# Patient Record
Sex: Female | Born: 1995 | Race: Black or African American | Hispanic: No | Marital: Single | Smoking: Former smoker
Health system: Southern US, Community
[De-identification: ages and names within clinical notes are randomized; demographics above are authoritative.]

## PROBLEM LIST (undated history)

## (undated) DIAGNOSIS — I10 Essential (primary) hypertension: Secondary | ICD-10-CM

## (undated) DIAGNOSIS — A6000 Herpesviral infection of urogenital system, unspecified: Secondary | ICD-10-CM

## (undated) DIAGNOSIS — Z789 Other specified health status: Secondary | ICD-10-CM

## (undated) HISTORY — DX: Other specified health status: Z78.9

## (undated) HISTORY — DX: Essential (primary) hypertension: I10

## (undated) HISTORY — PX: WRIST SURGERY: SHX841

---
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
## 2016-07-31 ENCOUNTER — Emergency Department (HOSPITAL_COMMUNITY)
Admission: EM | Admit: 2016-07-31 | Discharge: 2016-07-31 | Disposition: A | Discharge status: Home / Self Care | Payer: Medicaid Other | Attending: Emergency Medicine | Admitting: Emergency Medicine

## 2016-07-31 ENCOUNTER — Encounter (HOSPITAL_COMMUNITY): Payer: Self-pay

## 2016-07-31 DIAGNOSIS — R1110 Vomiting, unspecified: Secondary | ICD-10-CM | POA: Diagnosis present

## 2016-07-31 DIAGNOSIS — R112 Nausea with vomiting, unspecified: Secondary | ICD-10-CM | POA: Insufficient documentation

## 2016-07-31 DIAGNOSIS — R197 Diarrhea, unspecified: Secondary | ICD-10-CM

## 2016-07-31 LAB — URINALYSIS, ROUTINE W REFLEX MICROSCOPIC
Bilirubin Urine: NEGATIVE
Glucose, UA: NEGATIVE mg/dL
Hgb urine dipstick: NEGATIVE
Ketones, ur: 80 mg/dL — AB
Nitrite: NEGATIVE
Protein, ur: 30 mg/dL — AB
Specific Gravity, Urine: 1.021 (ref 1.005–1.030)
pH: 6 (ref 5.0–8.0)

## 2016-07-31 LAB — COMPREHENSIVE METABOLIC PANEL
ALT: 25 U/L (ref 14–54)
AST: 26 U/L (ref 15–41)
Albumin: 4.6 g/dL (ref 3.5–5.0)
Alkaline Phosphatase: 66 U/L (ref 38–126)
Anion gap: 7 (ref 5–15)
BUN: 15 mg/dL (ref 6–20)
CO2: 27 mmol/L (ref 22–32)
Calcium: 9.9 mg/dL (ref 8.9–10.3)
Chloride: 107 mmol/L (ref 101–111)
Creatinine, Ser: 0.68 mg/dL (ref 0.44–1.00)
GFR calc Af Amer: >60 mL/min (ref >60)
GFR calc non Af Amer: >60 mL/min (ref >60)
Glucose, Bld: 115 mg/dL — ABNORMAL HIGH (ref 65–99)
Potassium: 3.8 mmol/L (ref 3.5–5.1)
Sodium: 141 mmol/L (ref 135–145)
Total Bilirubin: 0.3 mg/dL (ref 0.3–1.2)
Total Protein: 8.4 g/dL — ABNORMAL HIGH (ref 6.5–8.1)

## 2016-07-31 LAB — I-STAT BETA HCG BLOOD, ED (MC, WL, AP ONLY): I-stat hCG, quantitative: <5 m[IU]/mL (ref <5)

## 2016-07-31 LAB — LIPASE, BLOOD: Lipase: <10 U/L — ABNORMAL LOW (ref 11–51)

## 2016-07-31 LAB — CBC
HCT: 38 % (ref 36.0–46.0)
Hemoglobin: 13.2 g/dL (ref 12.0–15.0)
MCH: 30.7 pg (ref 26.0–34.0)
MCHC: 34.7 g/dL (ref 30.0–36.0)
MCV: 88.4 fL (ref 78.0–100.0)
Platelets: 273 10*3/uL (ref 150–400)
RBC: 4.3 MIL/uL (ref 3.87–5.11)
RDW: 13 % (ref 11.5–15.5)
WBC: 11 10*3/uL — ABNORMAL HIGH (ref 4.0–10.5)

## 2016-07-31 MED ORDER — KETOROLAC TROMETHAMINE 30 MG/ML IJ SOLN
30.0000 mg | Freq: Once | INTRAMUSCULAR | Status: AC
  Administered 2016-07-31: 30 mg via INTRAVENOUS
  Filled 2016-07-31: qty 1

## 2016-07-31 MED ORDER — ONDANSETRON 4 MG PO TBDP: ORAL_TABLET | ORAL | 0 refills | Status: DC

## 2016-07-31 MED ORDER — ONDANSETRON HCL 4 MG/2ML IJ SOLN
4.0000 mg | Freq: Once | INTRAMUSCULAR | Status: AC
  Administered 2016-07-31: 4 mg via INTRAVENOUS
  Filled 2016-07-31: qty 2

## 2016-07-31 MED ORDER — GI COCKTAIL ~~LOC~~
30.0000 mL | Freq: Once | ORAL | Status: AC
  Administered 2016-07-31: 30 mL via ORAL
  Filled 2016-07-31: qty 30

## 2016-07-31 MED ORDER — SODIUM CHLORIDE 0.9 % IV BOLUS (SEPSIS)
1000.0000 mL | Freq: Once | INTRAVENOUS | Status: AC
  Administered 2016-07-31: 1000 mL via INTRAVENOUS

## 2016-07-31 NOTE — ED Provider Notes (Signed)
[facility]-EMERGENCY DEPT Provider Note   CSN: [id number] Arrival date & time: 07/31/16  1426     History   Chief Complaint Chief Complaint  Patient presents with  . Abdominal Pain  . Emesis    HPI Mackenzie Allen is a 21 y.o. female otherwise healthy here presenting with vomiting, loose stools. Patient states that she vomited about 6 times today. Also has some loose stools and abdominal cramping and chills. Denies any fevers or sick contacts or urinary symptoms. Patient has been unable to keep anything down.   The history is provided by the patient.    Past Medical History:  Diagnosis Date  . Herpes genitalia     There are no active problems to display for this patient.   Past Surgical History:  Procedure Laterality Date  . WRIST SURGERY      OB History    No data available       Home Medications    Prior to Admission medications   Medication Sig Start Date End Date Taking? Authorizing Provider  levonorgestrel (MIRENA) 20 MCG/24HR IUD 1 each by Intrauterine route once.   Yes [provider]  acyclovir (ZOVIRAX) 400 MG tablet Take 0.5 tablets (200 mg total) by mouth 5 (five) times daily. For 10 days Patient not taking: Reported on 07/31/2016 08/01/15   Jerre Simon, PA  benzonatate (TESSALON) 100 MG capsule Take 1 capsule (100 mg total) by mouth every 8 (eight) hours. Patient not taking: Reported on 08/01/2015 04/25/15   Melene Plan, DO  cephALEXin (KEFLEX) 500 MG capsule Take 1 capsule (500 mg total) by mouth 2 (two) times daily. Patient not taking: Reported on 07/31/2016 07/29/15   Audry Pili, PA-C  metroNIDAZOLE (FLAGYL) 500 MG tablet Take 1 tablet (500 mg total) by mouth 2 (two) times daily. Patient not taking: Reported on 07/31/2016 07/29/15   Audry Pili, PA-C  naproxen sodium (ANAPROX) 220 MG tablet Take 220 mg by mouth 2 (two) times daily as needed (pain).    [provider]    Family History History reviewed. No pertinent family  history.  Social History Social History  Substance Use Topics  . Smoking status: Never Smoker  . Smokeless tobacco: Never Used  . Alcohol use No     Allergies   Patient has no known allergies.   Review of Systems Review of Systems  Gastrointestinal: Positive for abdominal pain and vomiting.  All other systems reviewed and are negative.    Physical Exam Updated Vital Signs BP (!) 145/68 (BP Location: Left Arm)   Pulse 78   Temp 97.9 F (36.6 C) (Oral)   Resp 18   SpO2 100%   Physical Exam  Constitutional: She is oriented to person, place, and time. She appears well-developed.  HENT:  Head: Normocephalic.  MM dry   Eyes: Pupils are equal, round, and reactive to light. Conjunctivae and EOM are normal.  Neck: Normal range of motion. Neck supple.  Cardiovascular: Normal rate, regular rhythm and normal heart sounds.   Pulmonary/Chest: Effort normal and breath sounds normal. No respiratory distress. She has no wheezes.  Abdominal: Soft. Bowel sounds are normal.  Mild epigastric tenderness, no rebound   Musculoskeletal: Normal range of motion.  Neurological: She is alert and oriented to person, place, and time. No cranial nerve deficit. Coordination normal.  Skin: Skin is warm.  Psychiatric: She has a normal mood and affect.  Nursing note and vitals reviewed.    ED Treatments / Results  Labs (all  labs ordered are listed, but only abnormal results are displayed) Labs Reviewed  LIPASE, BLOOD - Abnormal; Notable for the following:       Result Value   Lipase <10 (*)    All other components within normal limits  COMPREHENSIVE METABOLIC PANEL - Abnormal; Notable for the following:    Glucose, Bld 115 (*)    Total Protein 8.4 (*)    All other components within normal limits  CBC - Abnormal; Notable for the following:    WBC 11.0 (*)    All other components within normal limits  URINALYSIS, ROUTINE W REFLEX MICROSCOPIC - Abnormal; Notable for the following:     APPearance CLOUDY (*)    Ketones, ur 80 (*)    Protein, ur 30 (*)    Leukocytes, UA SMALL (*)    Bacteria, UA FEW (*)    Squamous Epithelial / LPF TOO NUMEROUS TO COUNT (*)    All other components within normal limits  I-STAT BETA HCG BLOOD, ED ([facility], [facility], [facility] ONLY)    EKG  EKG Interpretation None       Radiology No results found.  Procedures Procedures (including critical care time)  Medications Ordered in ED Medications  sodium chloride 0.9 % bolus 1,000 mL (1,000 mLs Intravenous New Bag/Given 07/31/16 1859)  ondansetron (ZOFRAN) injection 4 mg (4 mg Intravenous Given 07/31/16 1859)  ketorolac (TORADOL) 30 MG/ML injection 30 mg (30 mg Intravenous Given 07/31/16 1859)  gi cocktail (Maalox,Lidocaine,Donnatal) (30 mLs Oral Given 07/31/16 1948)     Initial Impression / Assessment and Plan / ED Course  I have reviewed the triage vital signs and the nursing notes.  Pertinent labs & imaging results that were available during my care of the patient were reviewed by me and considered in my medical decision making (see chart for details).     Mackenzie Allen is a 21 y.o. female here with abdominal pain, vomiting, loose stool. Likely gastro. Will check labs, UA. Will hydrate and reassess.   8:28 PM Given IVF, zofran, toradol. Felt better. Labs unremarkable. UA + ketones and was contaminated and she has no dysuria. Will dc with zofran prn. Able to tolerate PO in the ED.    Final Clinical Impressions(s) / ED Diagnoses   Final diagnoses:  None    New Prescriptions New Prescriptions   No medications on file     Charlynne PanderYao, Sujey Gundry Hsienta, MD 07/31/16 2029

## 2016-07-31 NOTE — Discharge Instructions (Signed)
Take zofran as needed for nausea.   Take tylenol, motrin for pain.   Expect some nausea and loose stools for several days   See your doctor in a week   Return to ER if you have worse abdominal pain, vomiting, dehydration, fever

## 2016-07-31 NOTE — ED Triage Notes (Signed)
Pt c/o N/V and central abd pain that started this morning. Vomiting x1 in triage. Color was bright green. She denies pregancy. A&Ox4. Ambulatory. Denies fever or SOB.

## 2016-07-31 NOTE — ED Notes (Signed)
Patient able to keep down water and GI cocktail. Pt told to alert staff if there is any N/V

## 2016-07-31 NOTE — ED Triage Notes (Signed)
Pt from home via EMS with c/o generalized abdominal pain and emesis that began today around 0800. Pt was ambulatory with EMS. Pt did not experience emesis with EMS. Pt slept on ambulance in route

## 2016-09-23 ENCOUNTER — Emergency Department (HOSPITAL_COMMUNITY)
Admission: EM | Admit: 2016-09-23 | Discharge: 2016-09-23 | Disposition: A | Discharge status: Home / Self Care | Payer: Medicaid Other | Attending: Emergency Medicine | Admitting: Emergency Medicine

## 2016-09-23 DIAGNOSIS — K6289 Other specified diseases of anus and rectum: Secondary | ICD-10-CM | POA: Diagnosis present

## 2016-09-23 DIAGNOSIS — N3001 Acute cystitis with hematuria: Secondary | ICD-10-CM | POA: Diagnosis not present

## 2016-09-23 DIAGNOSIS — B009 Herpesviral infection, unspecified: Secondary | ICD-10-CM | POA: Insufficient documentation

## 2016-09-23 DIAGNOSIS — K644 Residual hemorrhoidal skin tags: Secondary | ICD-10-CM

## 2016-09-23 DIAGNOSIS — Z79899 Other long term (current) drug therapy: Secondary | ICD-10-CM | POA: Diagnosis not present

## 2016-09-23 LAB — COMPREHENSIVE METABOLIC PANEL
ALT: 15 U/L (ref 14–54)
AST: 26 U/L (ref 15–41)
Albumin: 4.5 g/dL (ref 3.5–5.0)
Alkaline Phosphatase: 66 U/L (ref 38–126)
Anion gap: 8 (ref 5–15)
BUN: 17 mg/dL (ref 6–20)
CO2: 25 mmol/L (ref 22–32)
Calcium: 9.5 mg/dL (ref 8.9–10.3)
Chloride: 103 mmol/L (ref 101–111)
Creatinine, Ser: 0.78 mg/dL (ref 0.44–1.00)
GFR calc Af Amer: >60 mL/min (ref >60)
GFR calc non Af Amer: >60 mL/min (ref >60)
Glucose, Bld: 90 mg/dL (ref 65–99)
Potassium: 4.2 mmol/L (ref 3.5–5.1)
Sodium: 136 mmol/L (ref 135–145)
Total Bilirubin: 0.8 mg/dL (ref 0.3–1.2)
Total Protein: 8.4 g/dL — ABNORMAL HIGH (ref 6.5–8.1)

## 2016-09-23 LAB — CBC WITH DIFFERENTIAL/PLATELET
Basophils Absolute: 0 10*3/uL (ref 0.0–0.1)
Basophils Relative: 0 %
Eosinophils Absolute: 0.1 10*3/uL (ref 0.0–0.7)
Eosinophils Relative: 2 %
HCT: 38.4 % (ref 36.0–46.0)
Hemoglobin: 12.9 g/dL (ref 12.0–15.0)
Lymphocytes Relative: 37 %
Lymphs Abs: 2.9 10*3/uL (ref 0.7–4.0)
MCH: 30.9 pg (ref 26.0–34.0)
MCHC: 33.6 g/dL (ref 30.0–36.0)
MCV: 92.1 fL (ref 78.0–100.0)
Monocytes Absolute: 0.4 10*3/uL (ref 0.1–1.0)
Monocytes Relative: 6 %
Neutro Abs: 4.3 10*3/uL (ref 1.7–7.7)
Neutrophils Relative %: 55 %
Platelets: 208 10*3/uL (ref 150–400)
RBC: 4.17 MIL/uL (ref 3.87–5.11)
RDW: 12.9 % (ref 11.5–15.5)
WBC: 7.8 10*3/uL (ref 4.0–10.5)

## 2016-09-23 LAB — URINALYSIS, ROUTINE W REFLEX MICROSCOPIC
Bilirubin Urine: NEGATIVE
Glucose, UA: NEGATIVE mg/dL
Ketones, ur: NEGATIVE mg/dL
Nitrite: NEGATIVE
Protein, ur: NEGATIVE mg/dL
Specific Gravity, Urine: 1.023 (ref 1.005–1.030)
pH: 5 (ref 5.0–8.0)

## 2016-09-23 LAB — POC URINE PREG, ED: Preg Test, Ur: NEGATIVE

## 2016-09-23 MED ORDER — ACYCLOVIR 800 MG PO TABS: 800.0000 mg | ORAL_TABLET | Freq: Two times a day (BID) | ORAL | 0 refills | Status: DC

## 2016-09-23 MED ORDER — HYDROCORTISONE 2.5 % RE CREA: 1.0000 "application " | TOPICAL_CREAM | Freq: Two times a day (BID) | RECTAL | 0 refills | Status: DC

## 2016-09-23 MED ORDER — VALACYCLOVIR HCL 500 MG PO TABS: 500.0000 mg | ORAL_TABLET | Freq: Once | ORAL | Status: DC

## 2016-09-23 MED ORDER — CEPHALEXIN 500 MG PO CAPS: 500.0000 mg | ORAL_CAPSULE | Freq: Two times a day (BID) | ORAL | 0 refills | Status: AC

## 2016-09-23 MED ORDER — ACYCLOVIR 200 MG PO CAPS
800.0000 mg | ORAL_CAPSULE | Freq: Once | ORAL | Status: AC
  Administered 2016-09-23: 800 mg via ORAL
  Filled 2016-09-23: qty 4

## 2016-09-23 MED ORDER — CEPHALEXIN 500 MG PO CAPS
500.0000 mg | ORAL_CAPSULE | Freq: Once | ORAL | Status: AC
  Administered 2016-09-23: 500 mg via ORAL
  Filled 2016-09-23: qty 1

## 2016-09-23 NOTE — Discharge Instructions (Signed)
Medications: Acyclovir, Keflex, Anusol-HC  Treatment: Take acyclovir twice daily for 5 days for herpes outbreak. Take Keflex twice daily for 7 days. Make sure to finish off this medication. A culture will be sent of your urine if the antibiotic needs to be changed. Apply Anusol-HCT or anal area twice daily to help with the hemorrhoid. Soak in room temperature water once daily as well to help.  Follow-up: Please follow-up with your doctor and/or an OB/GYN for further evaluation and treatment. Please return to the emergency department if you develop any new or worsening symptoms.

## 2016-09-23 NOTE — ED Triage Notes (Signed)
Pt c/o rectal pain and irritation.  States she feels it's a herpes outbreak as she has had them before.  Also c/o bilateral "ovary" pain and swelling.

## 2016-09-23 NOTE — ED Provider Notes (Signed)
[facility]-EMERGENCY DEPT Provider Note   CSN: [id number] Arrival date & time: 09/23/16  1659     History   Chief Complaint Chief Complaint  Patient presents with  . Rectal Pain    HPI Mackenzie Allen is a 21 y.o. female with history of genital herpes who presents with a three-day history of anal irritation. Patient also reports a 1 day history of bilateral lower abdominal pain. She reports it is better with curling up and worse with standing. She reports she just finished her menstrual cycle yesterday and it was normal. She denies any abnormal discharge or bleeding. She also denies any fevers, chest pain, shortness of breath, nausea, vomiting, diarrhea, or urinary symptoms. She is not currently sexually active and has no concern for STD exposure.  HPI  Past Medical History:  Diagnosis Date  . Herpes genitalia     There are no active problems to display for this patient.   Past Surgical History:  Procedure Laterality Date  . WRIST SURGERY      OB History    No data available       Home Medications    Prior to Admission medications   Medication Sig Start Date End Date Taking? Authorizing Provider  Aspirin-Salicylamide-Caffeine (BC HEADACHE POWDER PO) Take 1 packet by mouth daily as needed (headache).   Yes [provider]  chlorhexidine (PERIDEX) 0.12 % solution 50 mLs by Mouth Rinse route 2 (two) times daily. 08/26/16  Yes [provider]  levonorgestrel (MIRENA) 20 MCG/24HR IUD 1 each by Intrauterine route once.   Yes [provider]  ondansetron (ZOFRAN ODT) 4 MG disintegrating tablet  ODT q6 hours prn nausea/vomit 07/31/16  Yes Charlynne Pander, MD  acyclovir (ZOVIRAX) 800 MG tablet Take 1 tablet (800 mg total) by mouth 2 (two) times daily. 09/23/16   Hala Narula, Waylan Boga, PA-C  benzonatate (TESSALON) 100 MG capsule Take 1 capsule (100 mg total) by mouth every 8 (eight) hours. Patient not taking: Reported on 08/01/2015 04/25/15   Melene Plan, DO  cephALEXin (KEFLEX) 500 MG capsule Take 1 capsule (500 mg total) by mouth 2 (two) times daily. 09/23/16 09/30/16  Undine Nealis, Waylan Boga, PA-C  hydrocortisone (ANUSOL-HC) 2.5 % rectal cream Place 1 application rectally 2 (two) times daily. 09/23/16   Bralin Garry, Waylan Boga, PA-C  metroNIDAZOLE (FLAGYL) 500 MG tablet Take 1 tablet (500 mg total) by mouth 2 (two) times daily. Patient not taking: Reported on 07/31/2016 07/29/15   Audry Pili, PA-C    Family History No family history on file.  Social History Social History  Substance Use Topics  . Smoking status: Never Smoker  . Smokeless tobacco: Never Used  . Alcohol use No     Allergies   Patient has no known allergies.   Review of Systems Review of Systems  Constitutional: Negative for chills and fever.  HENT: Negative for facial swelling and sore throat.   Respiratory: Negative for shortness of breath.   Cardiovascular: Negative for chest pain.  Gastrointestinal: Positive for abdominal pain (bilateral lower abdominal pain). Negative for blood in stool, diarrhea, nausea and vomiting.  Genitourinary: Negative for dysuria, vaginal bleeding, vaginal discharge and vaginal pain.  Musculoskeletal: Negative for back pain.  Skin: Negative for rash and wound.  Neurological: Negative for headaches.  Psychiatric/Behavioral: The patient is not nervous/anxious.      Physical Exam Updated Vital Signs BP (!) 121/99 (BP Location: Right Arm)   Pulse 76   Temp 98.3 F (36.8 C) (Oral)  Resp 16   Ht 5\' 7"  (1.702 m)   Wt 59 kg (130 lb)   LMP 09/21/2016   SpO2 99%   BMI 20.36 kg/m   Physical Exam  Constitutional: She appears well-developed and well-nourished. No distress.  HENT:  Head: Normocephalic and atraumatic.  Mouth/Throat: Oropharynx is clear and moist. No oropharyngeal exudate.  Eyes: Pupils are equal, round, and reactive to light. Conjunctivae are normal. Right eye exhibits no discharge. Left eye exhibits no discharge. No scleral  icterus.  Neck: Normal range of motion. Neck supple. No thyromegaly present.  Cardiovascular: Normal rate, regular rhythm, normal heart sounds and intact distal pulses.  Exam reveals no gallop and no friction rub.   No murmur heard. Pulmonary/Chest: Effort normal and breath sounds normal. No stridor. No respiratory distress. She has no wheezes. She has no rales.  Abdominal: Soft. Bowel sounds are normal. She exhibits no distension. There is tenderness (mild) in the right lower quadrant and left lower quadrant. There is no rebound, no guarding and no CVA tenderness.    Genitourinary: Rectal exam shows external hemorrhoid.  Genitourinary Comments: External hemorrhoid, nonthrombosed; as well as a few small vesicles  Musculoskeletal: She exhibits no edema.  Lymphadenopathy:    She has no cervical adenopathy.  Neurological: She is alert. Coordination normal.  Skin: Skin is warm and dry. No rash noted. She is not diaphoretic. No pallor.  Psychiatric: She has a normal mood and affect.  Nursing note and vitals reviewed.    ED Treatments / Results  Labs (all labs ordered are listed, but only abnormal results are displayed) Labs Reviewed  COMPREHENSIVE METABOLIC PANEL - Abnormal; Notable for the following:       Result Value   Total Protein 8.4 (*)    All other components within normal limits  URINALYSIS, ROUTINE W REFLEX MICROSCOPIC - Abnormal; Notable for the following:    APPearance HAZY (*)    Hgb urine dipstick SMALL (*)    Leukocytes, UA LARGE (*)    Bacteria, UA RARE (*)    Squamous Epithelial / LPF 6-30 (*)    All other components within normal limits  URINE CULTURE  CBC WITH DIFFERENTIAL/PLATELET  POC URINE PREG, ED    EKG  EKG Interpretation None       Radiology No results found.  Procedures Procedures (including critical care time)  Medications Ordered in ED Medications  cephALEXin (KEFLEX) capsule 500 mg (500 mg Oral Given 09/23/16 2256)  acyclovir (ZOVIRAX)  200 MG capsule 800 mg (800 mg Oral Given 09/23/16 2256)     Initial Impression / Assessment and Plan / ED Course  I have reviewed the triage vital signs and the nursing notes.  Pertinent labs & imaging results that were available during my care of the patient were reviewed by me and considered in my medical decision making (see chart for details).     Patient with probable herpes outbreak as well as external hemorrhoid. Patient declined pelvic exam and pelvic ultrasound. Labs Unremarkable. UA shows large leukocytes, small hematuria. Urine culture sent. Considering the patient's benign abdominal exam and limited evaluation due to patient's decline of pelvic exam and ultrasound, I feel patient is safe for discharge with follow-up to OB/GYN. Discharge home with Keflex, acyclovir, and Anusol-HC. Strict return precautions given. Patient understands and agrees with plan. Patient vitals stable and discharged in satisfactory condition.  Final Clinical Impressions(s) / ED Diagnoses   Final diagnoses:  External hemorrhoid  Herpes infection  Acute cystitis with hematuria  New Prescriptions Discharge Medication List as of 09/23/2016 10:34 PM    START taking these medications   Details  hydrocortisone (ANUSOL-HC) 2.5 % rectal cream Place 1 application rectally 2 (two) times daily., Starting Sat 09/23/2016, Print         [address] [place], PA-C 09/23/16 1610    Jacalyn Lefevre, MD 09/23/16 2325

## 2016-09-25 LAB — URINE CULTURE
Culture: NO GROWTH
Special Requests: NORMAL

## 2017-03-26 ENCOUNTER — Encounter (HOSPITAL_COMMUNITY): Payer: Self-pay

## 2017-03-26 ENCOUNTER — Other Ambulatory Visit: Payer: Self-pay

## 2017-03-26 ENCOUNTER — Emergency Department (HOSPITAL_COMMUNITY)
Admission: EM | Admit: 2017-03-26 | Discharge: 2017-03-26 | Disposition: A | Discharge status: Home / Self Care | Payer: Medicaid Other | Attending: Emergency Medicine | Admitting: Emergency Medicine

## 2017-03-26 DIAGNOSIS — R112 Nausea with vomiting, unspecified: Secondary | ICD-10-CM | POA: Insufficient documentation

## 2017-03-26 DIAGNOSIS — Z7982 Long term (current) use of aspirin: Secondary | ICD-10-CM | POA: Insufficient documentation

## 2017-03-26 DIAGNOSIS — Z79899 Other long term (current) drug therapy: Secondary | ICD-10-CM | POA: Insufficient documentation

## 2017-03-26 LAB — COMPREHENSIVE METABOLIC PANEL
ALT: 19 U/L (ref 14–54)
AST: 25 U/L (ref 15–41)
Albumin: 4.3 g/dL (ref 3.5–5.0)
Alkaline Phosphatase: 73 U/L (ref 38–126)
Anion gap: 10 (ref 5–15)
BUN: 16 mg/dL (ref 6–20)
CO2: 24 mmol/L (ref 22–32)
Calcium: 9.7 mg/dL (ref 8.9–10.3)
Chloride: 105 mmol/L (ref 101–111)
Creatinine, Ser: 0.8 mg/dL (ref 0.44–1.00)
GFR calc Af Amer: >60 mL/min (ref >60)
GFR calc non Af Amer: >60 mL/min (ref >60)
Glucose, Bld: 129 mg/dL — ABNORMAL HIGH (ref 65–99)
Potassium: 3.8 mmol/L (ref 3.5–5.1)
Sodium: 139 mmol/L (ref 135–145)
Total Bilirubin: 0.7 mg/dL (ref 0.3–1.2)
Total Protein: 8.2 g/dL — ABNORMAL HIGH (ref 6.5–8.1)

## 2017-03-26 LAB — CBC
HCT: 40.3 % (ref 36.0–46.0)
Hemoglobin: 13.4 g/dL (ref 12.0–15.0)
MCH: 31.2 pg (ref 26.0–34.0)
MCHC: 33.3 g/dL (ref 30.0–36.0)
MCV: 93.7 fL (ref 78.0–100.0)
Platelets: 288 10*3/uL (ref 150–400)
RBC: 4.3 MIL/uL (ref 3.87–5.11)
RDW: 13.3 % (ref 11.5–15.5)
WBC: 16 10*3/uL — ABNORMAL HIGH (ref 4.0–10.5)

## 2017-03-26 LAB — URINALYSIS, ROUTINE W REFLEX MICROSCOPIC
Bilirubin Urine: NEGATIVE
Glucose, UA: NEGATIVE mg/dL
Hgb urine dipstick: NEGATIVE
Ketones, ur: 80 mg/dL — AB
Leukocytes, UA: NEGATIVE
Nitrite: NEGATIVE
Protein, ur: NEGATIVE mg/dL
Specific Gravity, Urine: 1.02 (ref 1.005–1.030)
pH: 7 (ref 5.0–8.0)

## 2017-03-26 LAB — I-STAT BETA HCG BLOOD, ED (MC, WL, AP ONLY): I-stat hCG, quantitative: <5 m[IU]/mL (ref <5)

## 2017-03-26 LAB — LIPASE, BLOOD: Lipase: 21 U/L (ref 11–51)

## 2017-03-26 MED ORDER — ONDANSETRON HCL 4 MG/2ML IJ SOLN
4.0000 mg | Freq: Once | INTRAMUSCULAR | Status: AC
  Administered 2017-03-26: 4 mg via INTRAVENOUS
  Filled 2017-03-26: qty 2

## 2017-03-26 MED ORDER — SODIUM CHLORIDE 0.9 % IV BOLUS (SEPSIS)
1000.0000 mL | Freq: Once | INTRAVENOUS | Status: AC
  Administered 2017-03-26: 1000 mL via INTRAVENOUS

## 2017-03-26 MED ORDER — CAPSAICIN 0.025 % EX CREA
TOPICAL_CREAM | Freq: Once | CUTANEOUS | Status: AC
  Administered 2017-03-26: 19:00:00 via TOPICAL
  Filled 2017-03-26: qty 60

## 2017-03-26 MED ORDER — ONDANSETRON 8 MG PO TBDP: 8.0000 mg | ORAL_TABLET | Freq: Three times a day (TID) | ORAL | 0 refills | Status: DC | PRN

## 2017-03-26 MED ORDER — HALOPERIDOL LACTATE 5 MG/ML IJ SOLN
2.0000 mg | Freq: Once | INTRAMUSCULAR | Status: AC
  Administered 2017-03-26: 2 mg via INTRAVENOUS
  Filled 2017-03-26: qty 1

## 2017-03-26 MED ORDER — PROMETHAZINE HCL 25 MG/ML IJ SOLN
25.0000 mg | Freq: Once | INTRAMUSCULAR | Status: AC
  Administered 2017-03-26: 25 mg via INTRAMUSCULAR
  Filled 2017-03-26: qty 1

## 2017-03-26 MED ORDER — PROMETHAZINE HCL 25 MG PO TABS: 25.0000 mg | ORAL_TABLET | Freq: Four times a day (QID) | ORAL | 0 refills | Status: DC | PRN

## 2017-03-26 NOTE — ED Notes (Signed)
Pt given gingerale. Pt tolerating well. Will continue to monitor.

## 2017-03-26 NOTE — ED Triage Notes (Signed)
Pt presents to the ed with two days of nausea vomiting and abdominal pain in the middle of her abdomen.  Patient is actively vomiting in triage.

## 2017-03-26 NOTE — ED Provider Notes (Signed)
Patient placed in Quick Look pathway, seen and evaluated   Chief Complaint: abdominal pain, nausea, vomiting  HPI:   Patient with nausea, vomiting, abdominal pain since yesterday.  States unable to keep anything down.  Reports diffuse abdominal cramping.  No vaginal discharge or bleeding.  No urinary symptoms.  States she passed out in the bathroom here in the waiting room.  Reports daily marijuana use.  ROS: Positive for nausea, vomiting, abdominal pain, diarrhea.  Negative for fever, chills, vaginal discharge or bleeding, urinary symptoms.  Physical Exam:   Gen: Patient sloughed over the chair, refuses to answer any questions initially  Neuro: Awake and Alert  Skin: Warm    Focused Exam: Lungs clear to auscultation bilaterally, regular heart rate and rhythm.  Abdomen diffusely tender, however no guarding or rebound tenderness.  Patient was seen by me when she was found laying on the floor in the middle of the hallway.  She states she feels better laying on the floor than sitting in the wheelchair.  Patient initially refusing to answer any questions, however then very cooperative and states she has been having abdominal pain, nausea, vomiting since yesterday.  She is asking for water.  Explained that we will hold off on giving her water until her nausea improves.  We also asked her to get off of the floor and we will try to find her bed.  She does report daily marijuana use.  She is rolling around in the wheelchair, and apparently possibly had a syncopal episode in the waiting room.  I ordered her Phenergan IM.  Labs and pregnancy test and urine analysis ordered.  Vitals:   03/26/17 1644  BP: (!) 148/99  Pulse: 92  Resp: 18  Temp: 97.7 F (36.5 C)  SpO2: 100%  Weight: 59 kg (130 lb)      Initiation of care has begun. The patient has been counseled on the process, plan, and necessity for staying for the completion/evaluation, and the remainder of the medical screening examination     Jaynie CrumbleKirichenko, Kenika Sahm, Cordelia Poche-C 03/26/17 1659    Azalia Bilisampos, Kevin, MD 03/26/17 1731

## 2017-03-26 NOTE — ED Notes (Signed)
Pt verbalizes understanding of d/c instructions. Pt received prescriptions. Pt ambulatory at d/c with all belongings.  

## 2017-03-26 NOTE — ED Provider Notes (Signed)
[facility] EMERGENCY DEPARTMENT Provider Note   CSN: [id number] Arrival date & time: 03/26/17  1615     History   Chief Complaint Chief Complaint  Patient presents with  . Emesis    HPI Mackenzie Allen is a 22 y.o. female.  HPI Mackenzie Allen is a 22 y.o. female presents to emergency department with complaint of nausea, vomiting, diarrhea, abdominal pain.  The she reports symptoms since yesterday.  She states she is unable to keep anything down.  Reports several episodes of dizziness and syncope because of the dehydration.  States she feels weak.  Denies any fever or chills.  No urinary symptoms.  No vaginal discharge or bleeding.  Denies being pregnant.  No treatment prior to coming in.  States she is unable to keep anything down.  Past Medical History:  Diagnosis Date  . Herpes genitalia     There are no active problems to display for this patient.   Past Surgical History:  Procedure Laterality Date  . WRIST SURGERY      OB History    No data available       Home Medications    Prior to Admission medications   Medication Sig Start Date End Date Taking? Authorizing Provider  acyclovir (ZOVIRAX) 800 MG tablet Take 1 tablet (800 mg total) by mouth 2 (two) times daily. 09/23/16   Law, Waylan Boga, PA-C  Aspirin-Salicylamide-Caffeine (BC HEADACHE POWDER PO) Take 1 packet by mouth daily as needed (headache).    [provider]  benzonatate (TESSALON) 100 MG capsule Take 1 capsule (100 mg total) by mouth every 8 (eight) hours. Patient not taking: Reported on 08/01/2015 04/25/15   Melene Plan, DO  chlorhexidine (PERIDEX) 0.12 % solution 50 mLs by Mouth Rinse route 2 (two) times daily. 08/26/16   [provider]  hydrocortisone (ANUSOL-HC) 2.5 % rectal cream Place 1 application rectally 2 (two) times daily. 09/23/16   Emi Holes, PA-C  levonorgestrel (MIRENA) 20 MCG/24HR IUD 1 each by Intrauterine route once.     [provider]  metroNIDAZOLE (FLAGYL) 500 MG tablet Take 1 tablet (500 mg total) by mouth 2 (two) times daily. Patient not taking: Reported on 07/31/2016 07/29/15   Audry Pili, PA-C  ondansetron [facility] ODT) 4 MG disintegrating tablet 4mg  ODT q6 hours prn nausea/vomit 07/31/16   Charlynne Pander, MD    Family History No family history on file.  Social History Social History   Tobacco Use  . Smoking status: Never Smoker  . Smokeless tobacco: Never Used  Substance Use Topics  . Alcohol use: No  . Drug use: No     Allergies   Patient has no known allergies.   Review of Systems Review of Systems  Constitutional: Negative for chills and fever.  Respiratory: Negative for cough, chest tightness and shortness of breath.   Cardiovascular: Negative for chest pain, palpitations and leg swelling.  Gastrointestinal: Positive for abdominal pain, diarrhea, nausea and vomiting.  Genitourinary: Negative for dysuria, flank pain, pelvic pain, vaginal bleeding, vaginal discharge and vaginal pain.  Musculoskeletal: Negative for arthralgias, myalgias, neck pain and neck stiffness.  Skin: Negative for rash.  Neurological: Negative for dizziness, weakness and headaches.  All other systems reviewed and are negative.    Physical Exam Updated Vital Signs BP (!) 148/99   Pulse 92   Temp 97.7 F (36.5 C)   Resp (!) 24   Wt 59 kg (130 lb)   SpO2 100%  BMI 20.36 kg/m   Physical Exam  Constitutional: She appears well-developed and well-nourished. No distress.  HENT:  Head: Normocephalic.  Eyes: Conjunctivae are normal.  Neck: Neck supple.  Cardiovascular: Normal rate, regular rhythm and normal heart sounds.  Pulmonary/Chest: Effort normal and breath sounds normal. No respiratory distress. She has no wheezes. She has no rales.  Abdominal: Soft. Bowel sounds are normal. She exhibits no distension. There is tenderness. There is no rebound and no guarding.  Musculoskeletal: She  exhibits no edema.  Neurological: She is alert.  Skin: Skin is warm and dry.  Psychiatric: She has a normal mood and affect. Her behavior is normal.  Nursing note and vitals reviewed.    ED Treatments / Results  Labs (all labs ordered are listed, but only abnormal results are displayed) Labs Reviewed  COMPREHENSIVE METABOLIC PANEL - Abnormal; Notable for the following components:      Result Value   Glucose, Bld 129 (*)    Total Protein 8.2 (*)    All other components within normal limits  CBC - Abnormal; Notable for the following components:   WBC 16.0 (*)    All other components within normal limits  URINALYSIS, ROUTINE W REFLEX MICROSCOPIC - Abnormal; Notable for the following components:   Ketones, ur 80 (*)    All other components within normal limits  LIPASE, BLOOD  I-STAT BETA HCG BLOOD, ED ([facility], [facility], [facility] ONLY)    EKG  EKG Interpretation  Date/Time:  Monday March 26 2017 17:13:47 EDT Ventricular Rate:  64 PR Interval:    QRS Duration: 81 QT Interval:  420 QTC Calculation: 434 R Axis:   68 Text Interpretation:  Sinus rhythm No old tracing to compare Confirmed by Azalia Bilis ([id number]) on 03/26/2017 5:31:05 PM       Radiology No results found.  Procedures Procedures (including critical care time)  Medications Ordered in ED Medications  promethazine (PHENERGAN) injection 25 mg (25 mg Intramuscular Given 03/26/17 1703)  sodium chloride 0.9 % bolus 1,000 mL (0 mLs Intravenous Stopped 03/26/17 1948)  capsaicin (ZOSTRIX) 0.025 % cream ( Topical Given 03/26/17 1841)  haloperidol lactate (HALDOL) injection 2 mg (2 mg Intravenous Given 03/26/17 1951)  sodium chloride 0.9 % bolus 1,000 mL (1,000 mLs Intravenous New Bag/Given 03/26/17 1951)     Initial Impression / Assessment and Plan / ED Course  I have reviewed the triage vital signs and the nursing notes.  Pertinent labs & imaging results that were available during my care of the patient were reviewed by me and  considered in my medical decision making (see chart for details).     Patient was asked to be seen when she was laying on the floor in the middle of the hallway.  She is refusing to get into a wheelchair.  She reports that she feels weak and lightheaded and unable to sit up.  Patient apparently passed out in the bathroom in the waiting room, however this was unwitnessed.  She is throwing up but there is no emesis in her bag.  She does not have IV at this time, will give her a shot of Phenergan for her nausea and vomiting and try to find her bed.   Patient feels better after Phenergan but states she still dry heaving.  Tried Spacing cream and Haldol.  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  Her abdomen is soft, diffusely tender but no guarding or rebound tenderness.  I do not think she needs any imaging at this time.   9:10 PM Her pain has improved, her nausea is improved as well.  She was able to drink a few sips of ginger ale.  Although she states she still dry heaving, she is not vomiting.  She would like to try to go home.  I will give a prescription for Phenergan and for Zofran.  We discussed cutting down on marijuana use.  Discussed taking warm baths and trying capsaicin cream on her abdomen.  Patient agreed.  Advised to return if worsening symptoms.  Vitals:   03/26/17 1644 03/26/17 1915 03/26/17 1930 03/26/17 1945  BP: (!) 148/99     Pulse: 92     Resp: 18 17 (!) 24 (!) 24  Temp: 97.7 F (36.5 C)     SpO2: 100%     Weight: 59 kg (130 lb)       Final Clinical Impressions(s) / ED Diagnoses   Final diagnoses:  Intractable vomiting with nausea, unspecified vomiting type    ED Discharge Orders        Ordered    ondansetron (ZOFRAN ODT) 8 MG disintegrating tablet  Every 8 hours PRN     03/26/17 2111    promethazine (PHENERGAN) 25 MG tablet  Every 6 hours PRN     03/26/17 2111       Jaynie CrumbleKirichenko, Trevan Messman,  Cordelia Poche-C 03/26/17 2112    Azalia Bilisampos, Kevin, MD 03/26/17 2155

## 2017-03-26 NOTE — ED Notes (Signed)
Pt. Requested some ginger ale, walked in pt.'s room, pt. Gagging like vomiting in bag. Did not give pt. The ginger ale.

## 2017-03-26 NOTE — Discharge Instructions (Signed)
Try warm baths and capsaicin cream to your abdomen.  Take Zofran and Phenergan as prescribed as needed for nausea and vomiting.  Avoid smoking marijuana.  Follow-up with your family doctor.  Please return if unable to keep any fluids or medications down.

## 2017-04-10 IMAGING — CR DG CHEST 2V
2 series · 2 of 2 positions shown · non-contrast
Comparison: Chest radiograph August 21, 2007

CLINICAL DATA: Sore throat, body ache, nausea and vomiting
beginning yesterday.

EXAM:
CHEST  2 VIEW

[w chest pa]
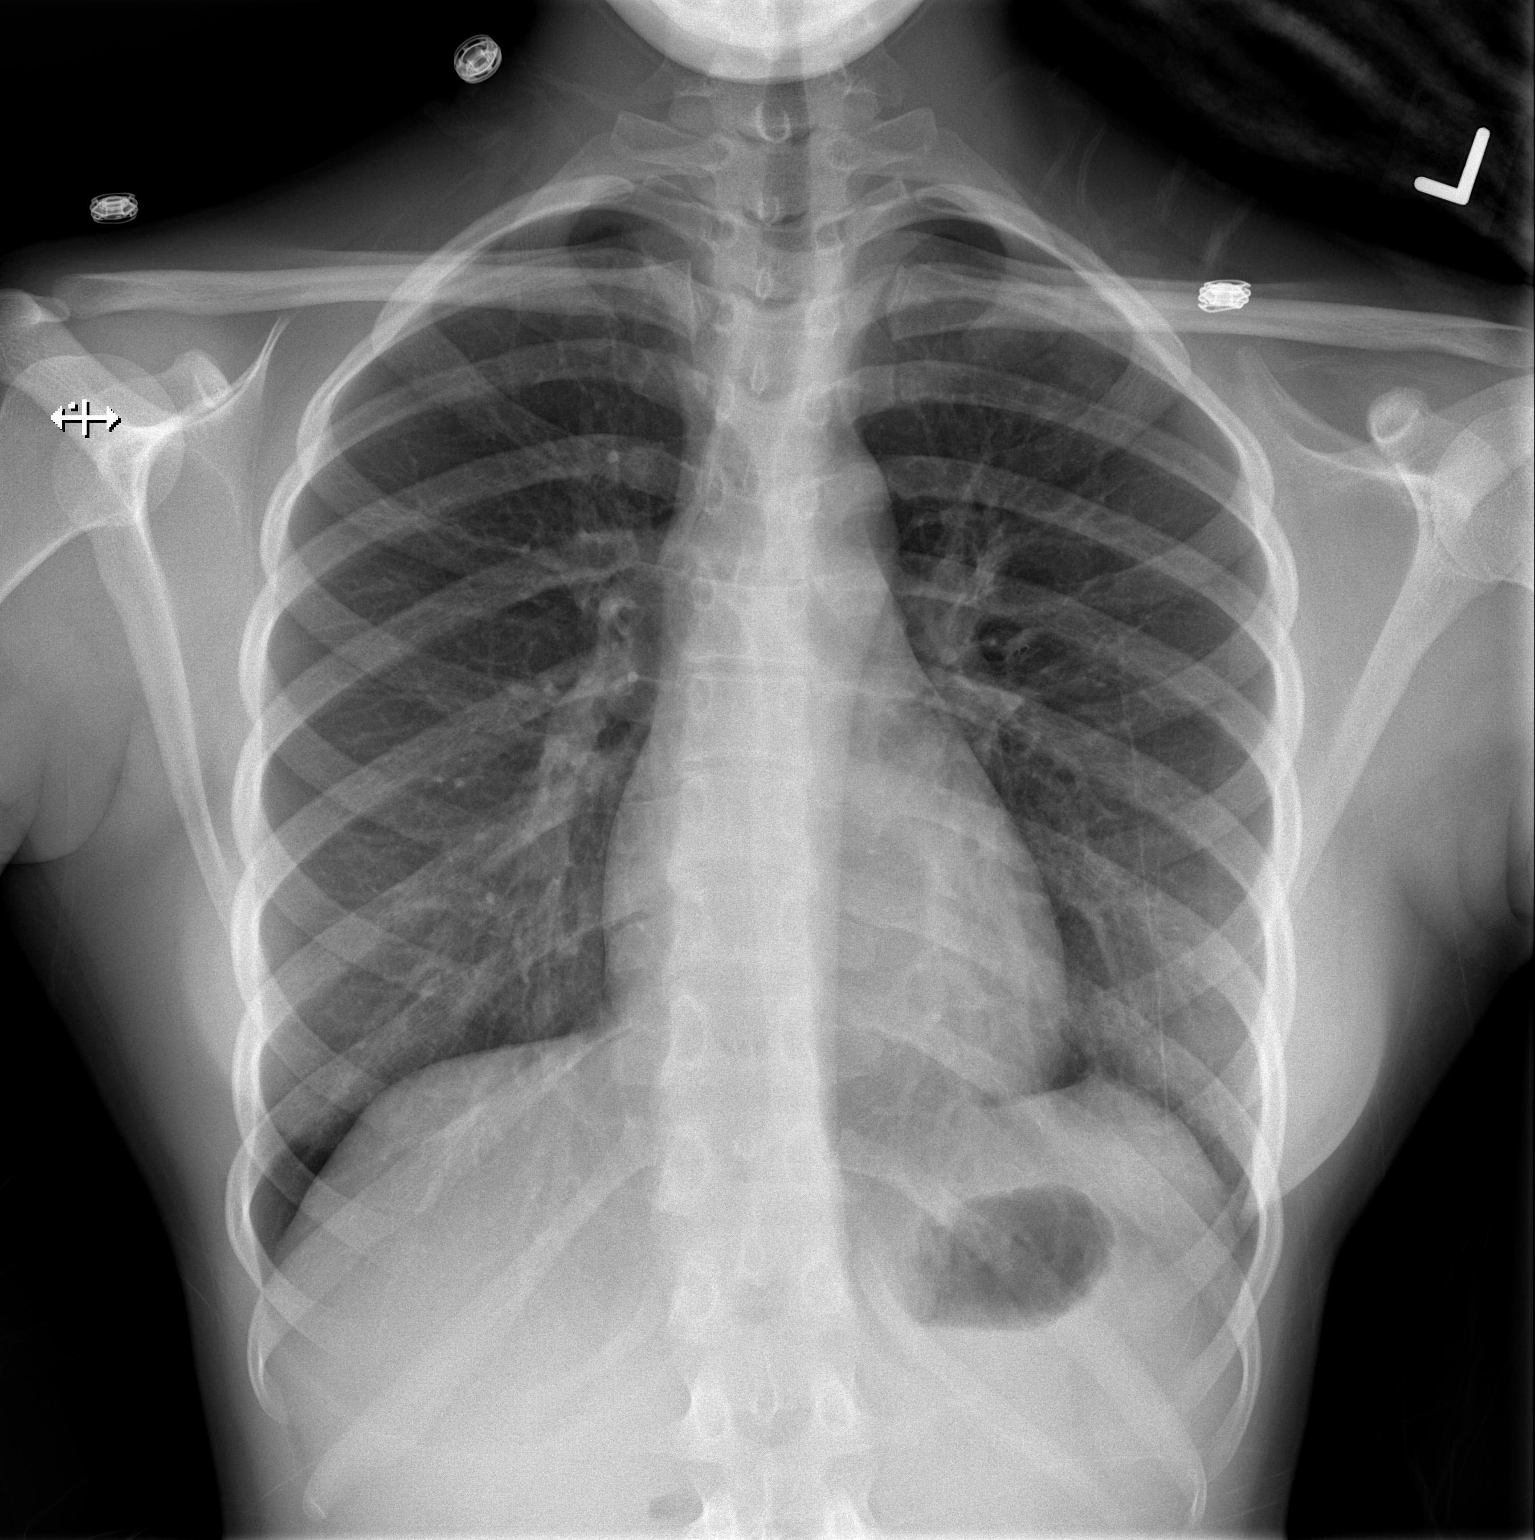

[w chest lat]
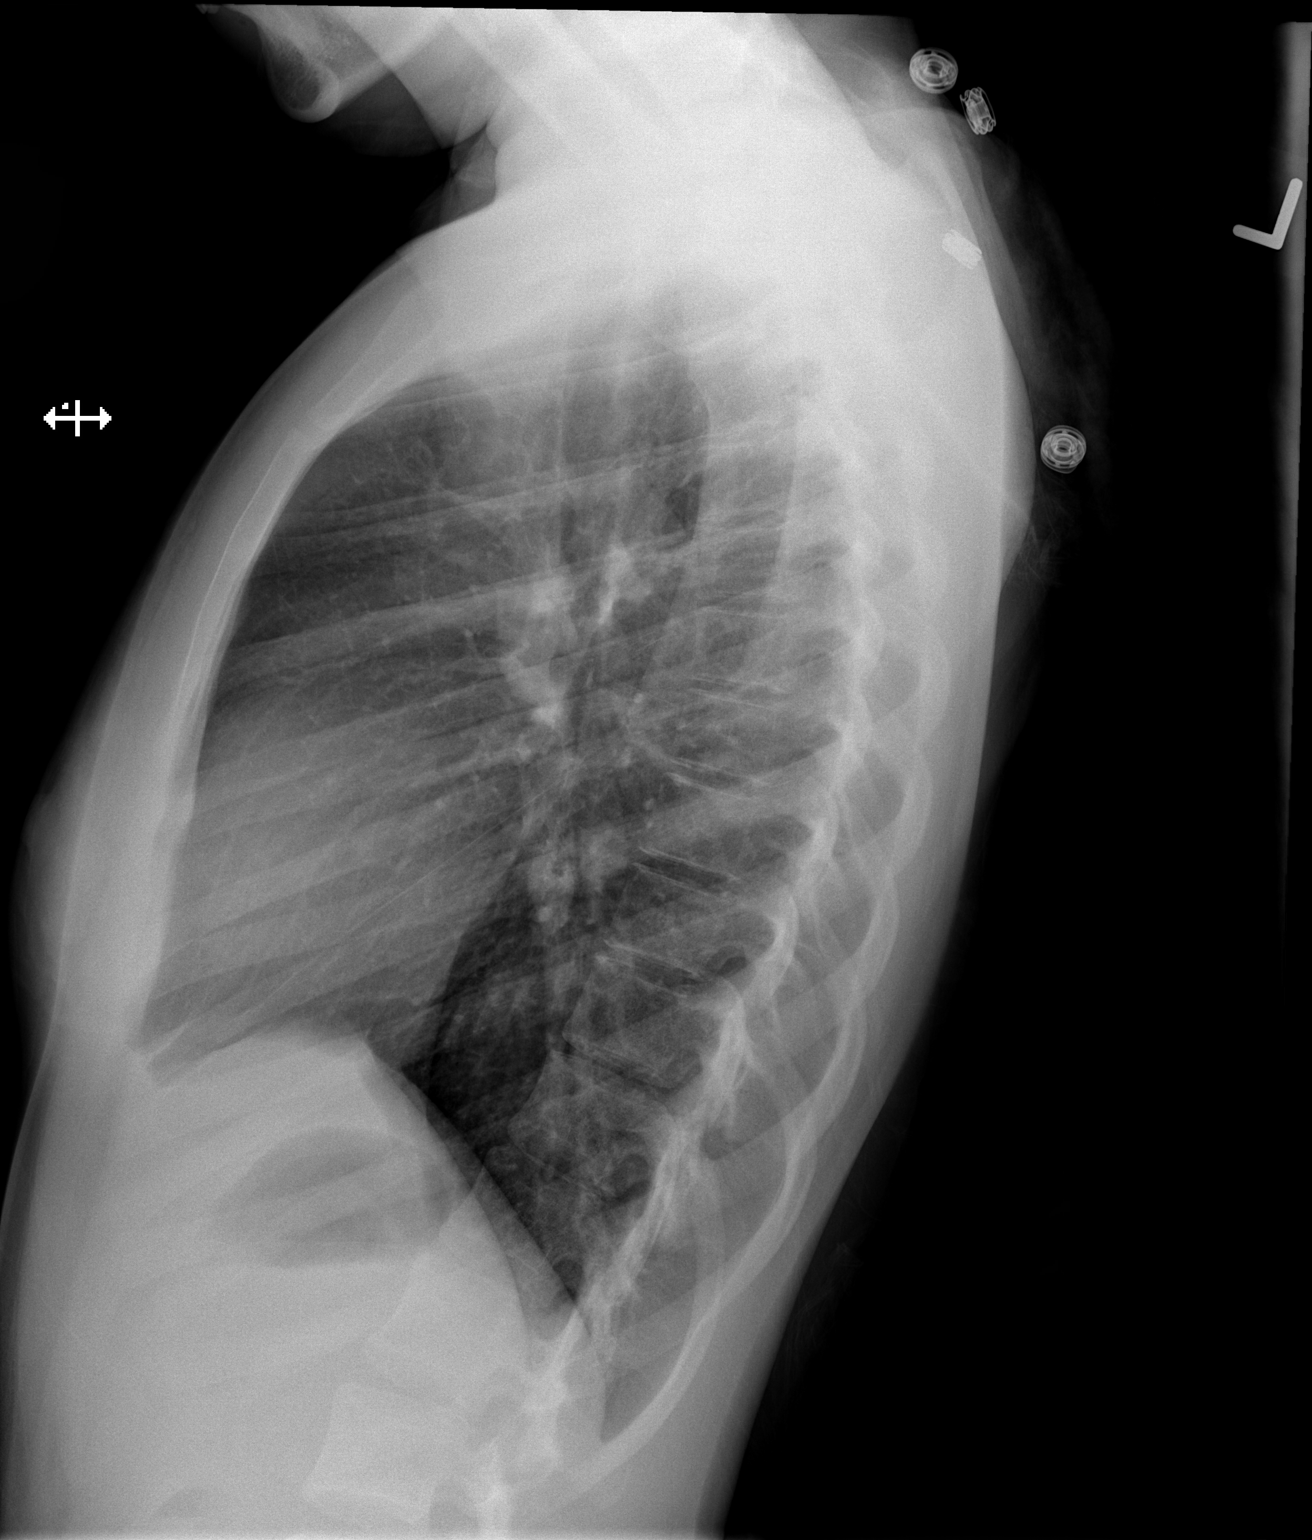

[2 of 2 positions shown; findings below may reference images not displayed]

FINDINGS: Cardiomediastinal silhouette is normal. The lungs are clear without
pleural effusions or focal consolidations. Trachea projects midline
and there is no pneumothorax. Soft tissue planes and included
osseous structures are non-suspicious.
IMPRESSION: Normal chest.

## 2018-10-21 ENCOUNTER — Emergency Department (HOSPITAL_COMMUNITY)
Admission: EM | Admit: 2018-10-21 | Discharge: 2018-10-21 | Discharge status: Against Medical Advice | Payer: Medicaid Other | Attending: Emergency Medicine | Admitting: Emergency Medicine

## 2018-10-21 ENCOUNTER — Encounter (HOSPITAL_COMMUNITY): Payer: Self-pay | Admitting: Family Medicine

## 2018-10-21 DIAGNOSIS — Y999 Unspecified external cause status: Secondary | ICD-10-CM | POA: Insufficient documentation

## 2018-10-21 DIAGNOSIS — Y93I9 Activity, other involving external motion: Secondary | ICD-10-CM | POA: Insufficient documentation

## 2018-10-21 DIAGNOSIS — F17290 Nicotine dependence, other tobacco product, uncomplicated: Secondary | ICD-10-CM | POA: Insufficient documentation

## 2018-10-21 DIAGNOSIS — Z79899 Other long term (current) drug therapy: Secondary | ICD-10-CM | POA: Insufficient documentation

## 2018-10-21 DIAGNOSIS — Y92481 Parking lot as the place of occurrence of the external cause: Secondary | ICD-10-CM | POA: Insufficient documentation

## 2018-10-21 DIAGNOSIS — R519 Headache, unspecified: Secondary | ICD-10-CM | POA: Insufficient documentation

## 2018-10-21 MED ORDER — METHOCARBAMOL 500 MG PO TABS: 500.0000 mg | ORAL_TABLET | Freq: Three times a day (TID) | ORAL | 0 refills | Status: AC

## 2018-10-21 NOTE — ED Notes (Signed)
Patient walked out of room and stated she was leaving. She appeared in no distress.
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
## 2019-07-22 ENCOUNTER — Encounter (HOSPITAL_COMMUNITY): Payer: Self-pay

## 2019-07-22 ENCOUNTER — Ambulatory Visit (INDEPENDENT_AMBULATORY_CARE_PROVIDER_SITE_OTHER): Payer: Self-pay

## 2019-07-22 ENCOUNTER — Other Ambulatory Visit: Payer: Self-pay

## 2019-07-22 ENCOUNTER — Ambulatory Visit (HOSPITAL_COMMUNITY)
Admission: EM | Admit: 2019-07-22 | Discharge: 2019-07-22 | Disposition: A | Discharge status: Home / Self Care | Payer: Self-pay | Attending: Family Medicine | Admitting: Family Medicine

## 2019-07-22 DIAGNOSIS — M79601 Pain in right arm: Secondary | ICD-10-CM

## 2019-07-22 MED ORDER — IBUPROFEN 600 MG PO TABS: 600.0000 mg | ORAL_TABLET | Freq: Four times a day (QID) | ORAL | 0 refills | Status: DC | PRN

## 2019-07-22 MED ORDER — TIZANIDINE HCL 4 MG PO TABS: 4.0000 mg | ORAL_TABLET | Freq: Four times a day (QID) | ORAL | 0 refills | Status: DC | PRN
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
## 2019-07-22 NOTE — Discharge Instructions (Signed)
Take ibuprofen 3 times a day with food Take tizanidine as needed This is a muscle relaxer.  It is useful at bedtime Use ice or heat to painful areas Return if not improving in a few days
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
## 2020-01-16 ENCOUNTER — Emergency Department (HOSPITAL_COMMUNITY)
Admission: EM | Admit: 2020-01-16 | Discharge: 2020-01-17 | Disposition: A | Discharge status: Home / Self Care | Payer: Medicaid Other | Attending: Emergency Medicine | Admitting: Emergency Medicine

## 2020-01-16 ENCOUNTER — Encounter (HOSPITAL_COMMUNITY): Payer: Self-pay | Admitting: *Deleted

## 2020-01-16 ENCOUNTER — Other Ambulatory Visit: Payer: Self-pay

## 2020-01-16 DIAGNOSIS — R109 Unspecified abdominal pain: Secondary | ICD-10-CM | POA: Diagnosis not present

## 2020-01-16 DIAGNOSIS — O26899 Other specified pregnancy related conditions, unspecified trimester: Secondary | ICD-10-CM | POA: Insufficient documentation

## 2020-01-16 DIAGNOSIS — Z5321 Procedure and treatment not carried out due to patient leaving prior to being seen by health care provider: Secondary | ICD-10-CM | POA: Insufficient documentation

## 2020-01-16 LAB — COMPREHENSIVE METABOLIC PANEL
ALT: 23 U/L (ref 0–44)
AST: 23 U/L (ref 15–41)
Albumin: 4.3 g/dL (ref 3.5–5.0)
Alkaline Phosphatase: 62 U/L (ref 38–126)
Anion gap: 13 (ref 5–15)
BUN: 11 mg/dL (ref 6–20)
CO2: 21 mmol/L — ABNORMAL LOW (ref 22–32)
Calcium: 10 mg/dL (ref 8.9–10.3)
Chloride: 101 mmol/L (ref 98–111)
Creatinine, Ser: 0.73 mg/dL (ref 0.44–1.00)
GFR, Estimated: >60 mL/min (ref >60)
Glucose, Bld: 104 mg/dL — ABNORMAL HIGH (ref 70–99)
Potassium: 3.4 mmol/L — ABNORMAL LOW (ref 3.5–5.1)
Sodium: 135 mmol/L (ref 135–145)
Total Bilirubin: 0.6 mg/dL (ref 0.3–1.2)
Total Protein: 7.6 g/dL (ref 6.5–8.1)

## 2020-01-16 LAB — URINALYSIS, ROUTINE W REFLEX MICROSCOPIC
Bacteria, UA: NONE SEEN
Bilirubin Urine: NEGATIVE
Glucose, UA: NEGATIVE mg/dL
Ketones, ur: 80 mg/dL — AB
Nitrite: NEGATIVE
Protein, ur: 100 mg/dL — AB
Specific Gravity, Urine: 1.027 (ref 1.005–1.030)
pH: 5 (ref 5.0–8.0)

## 2020-01-16 LAB — CBC
HCT: 39.5 % (ref 36.0–46.0)
Hemoglobin: 13.5 g/dL (ref 12.0–15.0)
MCH: 30.9 pg (ref 26.0–34.0)
MCHC: 34.2 g/dL (ref 30.0–36.0)
MCV: 90.4 fL (ref 80.0–100.0)
Platelets: 232 10*3/uL (ref 150–400)
RBC: 4.37 MIL/uL (ref 3.87–5.11)
RDW: 12 % (ref 11.5–15.5)
WBC: 10.1 10*3/uL (ref 4.0–10.5)
nRBC: 0 % (ref 0.0–0.2)

## 2020-01-16 LAB — LIPASE, BLOOD: Lipase: 16 U/L (ref 11–51)

## 2020-01-16 LAB — I-STAT BETA HCG BLOOD, ED (MC, WL, AP ONLY): I-stat hCG, quantitative: >2000 m[IU]/mL — ABNORMAL HIGH (ref <5)

## 2020-01-16 NOTE — ED Triage Notes (Signed)
The pt thinks that her Nat Math is out of place  She had a home pregnancy test that was positive lmp mov 18th

## 2020-01-17 NOTE — L&D Delivery Note (Addendum)
LABOR COURSE Patient admitted on 8/21 for induction on labor for chronic hypertension. She received three doses of Cytotec and had a foley placed at 0010 on 8/22, foley fell out at 0400 and pitocin was started at 0430. She progressed to complete at 0830.   Delivery Note Called to room and patient was complete and pushing. Head delivered ROA. No nuchal cord present. Shoulder and body delivered in usual fashion and without complication. At  0900 a healthy female was delivered via Vaginal, Spontaneous vertex.  Infant with spontaneous cry, placed on mother's abdomen, dried and stimulated. Cord clamped x 2 after 1-2-minute delay, and cut by FOB. Cord blood drawn. Placenta delivered spontaneously with gentle cord traction. Appears intact. Fundus firm with massage and Pitocin. Labia, perineum, vagina, and cervix inspected with minor bilateral periurethral tears that were hemostatic, not requiring repair.    APGAR: 8, 9; weight  pending.   Cord: 3VC with the following complications:true knot with preserved blood flow.    Anesthesia:  Epidural Episiotomy: None Lacerations: Bilateral periurethral, hemostatic, not repaired  Est. Blood Loss (mL): 400  Mom to postpartum.  Baby to Couplet care / Skin to Skin.  Alicia Amel, MD 09/06/20 9:37 AM    I was present and gloved with the resident for the entire delivery. I agree with the note above.   Thressa Sheller DNP, CNM  09/06/20  11:32 AM

## 2020-01-17 NOTE — ED Notes (Signed)
Called patients name several times in waiting room to get vitals. No response from patient.

## 2020-01-18 ENCOUNTER — Other Ambulatory Visit: Payer: Self-pay

## 2020-01-18 ENCOUNTER — Inpatient Hospital Stay (HOSPITAL_COMMUNITY): Payer: Medicaid Other

## 2020-01-18 ENCOUNTER — Inpatient Hospital Stay (HOSPITAL_COMMUNITY)
Admission: AD | Admit: 2020-01-18 | Discharge: 2020-01-18 | Disposition: A | Discharge status: Home / Self Care | Payer: Medicaid Other | Attending: Obstetrics and Gynecology | Admitting: Obstetrics and Gynecology

## 2020-01-18 ENCOUNTER — Encounter (HOSPITAL_COMMUNITY): Payer: Self-pay | Admitting: Emergency Medicine

## 2020-01-18 DIAGNOSIS — O200 Threatened abortion: Secondary | ICD-10-CM

## 2020-01-18 DIAGNOSIS — Z87891 Personal history of nicotine dependence: Secondary | ICD-10-CM | POA: Insufficient documentation

## 2020-01-18 DIAGNOSIS — T8331XA Breakdown (mechanical) of intrauterine contraceptive device, initial encounter: Secondary | ICD-10-CM

## 2020-01-18 DIAGNOSIS — Z7982 Long term (current) use of aspirin: Secondary | ICD-10-CM | POA: Insufficient documentation

## 2020-01-18 DIAGNOSIS — O209 Hemorrhage in early pregnancy, unspecified: Secondary | ICD-10-CM

## 2020-01-18 DIAGNOSIS — O418X10 Other specified disorders of amniotic fluid and membranes, first trimester, not applicable or unspecified: Secondary | ICD-10-CM | POA: Diagnosis present

## 2020-01-18 DIAGNOSIS — O208 Other hemorrhage in early pregnancy: Secondary | ICD-10-CM | POA: Diagnosis not present

## 2020-01-18 DIAGNOSIS — Z331 Pregnant state, incidental: Secondary | ICD-10-CM

## 2020-01-18 DIAGNOSIS — O10919 Unspecified pre-existing hypertension complicating pregnancy, unspecified trimester: Secondary | ICD-10-CM | POA: Diagnosis present

## 2020-01-18 DIAGNOSIS — O10011 Pre-existing essential hypertension complicating pregnancy, first trimester: Secondary | ICD-10-CM | POA: Diagnosis not present

## 2020-01-18 DIAGNOSIS — O468X1 Other antepartum hemorrhage, first trimester: Secondary | ICD-10-CM

## 2020-01-18 DIAGNOSIS — Z3A01 Less than 8 weeks gestation of pregnancy: Secondary | ICD-10-CM

## 2020-01-18 DIAGNOSIS — Z791 Long term (current) use of non-steroidal anti-inflammatories (NSAID): Secondary | ICD-10-CM | POA: Diagnosis not present

## 2020-01-18 DIAGNOSIS — O210 Mild hyperemesis gravidarum: Secondary | ICD-10-CM

## 2020-01-18 DIAGNOSIS — R109 Unspecified abdominal pain: Secondary | ICD-10-CM | POA: Insufficient documentation

## 2020-01-18 LAB — CBC WITH DIFFERENTIAL/PLATELET
Abs Immature Granulocytes: 0.02 10*3/uL (ref 0.00–0.07)
Basophils Absolute: 0 10*3/uL (ref 0.0–0.1)
Basophils Relative: 0 %
Eosinophils Absolute: 0 10*3/uL (ref 0.0–0.5)
Eosinophils Relative: 0 %
HCT: 39.1 % (ref 36.0–46.0)
Hemoglobin: 14.4 g/dL (ref 12.0–15.0)
Immature Granulocytes: 0 %
Lymphocytes Relative: 17 %
Lymphs Abs: 1.5 10*3/uL (ref 0.7–4.0)
MCH: 32.1 pg (ref 26.0–34.0)
MCHC: 36.8 g/dL — ABNORMAL HIGH (ref 30.0–36.0)
MCV: 87.3 fL (ref 80.0–100.0)
Monocytes Absolute: 0.4 10*3/uL (ref 0.1–1.0)
Monocytes Relative: 5 %
Neutro Abs: 6.9 10*3/uL (ref 1.7–7.7)
Neutrophils Relative %: 78 %
Platelets: 265 10*3/uL (ref 150–400)
RBC: 4.48 MIL/uL (ref 3.87–5.11)
RDW: 11.9 % (ref 11.5–15.5)
WBC: 8.9 10*3/uL (ref 4.0–10.5)
nRBC: 0 % (ref 0.0–0.2)

## 2020-01-18 LAB — WET PREP, GENITAL
Clue Cells Wet Prep HPF POC: NONE SEEN
Trich, Wet Prep: NONE SEEN
Yeast Wet Prep HPF POC: NONE SEEN

## 2020-01-18 LAB — URINALYSIS, ROUTINE W REFLEX MICROSCOPIC
Bilirubin Urine: NEGATIVE
Glucose, UA: NEGATIVE mg/dL
Ketones, ur: 80 mg/dL — AB
Nitrite: NEGATIVE
Protein, ur: 100 mg/dL — AB
Specific Gravity, Urine: 1.032 — ABNORMAL HIGH (ref 1.005–1.030)
pH: 6 (ref 5.0–8.0)

## 2020-01-18 LAB — HCG, QUANTITATIVE, PREGNANCY: hCG, Beta Chain, Quant, S: 68713 m[IU]/mL — ABNORMAL HIGH (ref <5)

## 2020-01-18 LAB — ABO/RH: ABO/RH(D): O POS

## 2020-01-18 MED ORDER — PROMETHAZINE HCL 25 MG PO TABS: 25.0000 mg | ORAL_TABLET | Freq: Four times a day (QID) | ORAL | 0 refills | Status: DC | PRN

## 2020-01-18 MED ORDER — METOCLOPRAMIDE HCL 10 MG PO TABS: 10.0000 mg | ORAL_TABLET | Freq: Four times a day (QID) | ORAL | 0 refills | Status: DC

## 2020-01-18 MED ORDER — LABETALOL HCL 100 MG PO TABS: 100.0000 mg | ORAL_TABLET | Freq: Two times a day (BID) | ORAL | 6 refills | Status: DC

## 2020-01-18 NOTE — MAU Note (Signed)
Mackenzie Allen is a 25 y.o. at [redacted]w[redacted]d here in MAU reporting: abdominal pain and bleeding since yesterday. States last night bleeding got a little bit heavier, is not saturating pads. States she has a mirena in place and would like it removed.   LMP: 12/04/19  Onset of complaint: yesterday  Pain score: 7/10  Vitals:   01/18/20 0852 01/18/20 0959  BP: (!) 150/100 (!) 138/106  Pulse: 63 77  Resp: 19 16  Temp: 98.6 F (37 C) 98.4 F (36.9 C)  SpO2: 99% 100%     Lab orders placed from triage: UA

## 2020-01-18 NOTE — ED Notes (Signed)
Greta Doom, PA assessed pt at triage and report called to [facility] in MAU.  Transport on the way to take pt.

## 2020-01-18 NOTE — ED Provider Notes (Addendum)
I was requested to screen this patient for MAU.  This is a 25 year old G1, P0 who is approximately [redacted] weeks pregnant without confirmed IUP presenting with low abdominal cramping and vaginal bleeding since yesterday.  She reports she still has Mirena IUD in place.  Pt will be transfer to MAU for further care.       Fayrene Helper, PA-C 01/18/20 0911    Tilden Fossa, MD 01/18/20 [phone]

## 2020-01-18 NOTE — MAU Provider Note (Addendum)
History     CSN: [id number]  Arrival date and time: 01/18/20 4401   Event Date/Time   First Provider Initiated Contact with Patient 01/18/20 1106      Chief Complaint  Patient presents with  . Abdominal Pain    + preg test   HPI  Mackenzie Allen is a 25 y.o. G1P0 @ [redacted]w[redacted]d who presents to the MAU with c/o abdominal pain. The pain is located in the lower abdomen and she rates the pain 7/10. She describes the pain as sharp shooting pain. She reports small amount vaginal bleeding that requires wearing a pad.  Patient has IUD (Mirena) that was placed about 4 years ago at [facility]. Patient reports that she usually has regular periods but has not had one since 11/18 and lasted 4 days, which is her usual. Sexually active with one female partner x 4 years. Hx of genital HSV dx 2 years ago but no outbreak since then.   OB History    Gravida  1   Para      Term      Preterm      AB      Living        SAB      IAB      Ectopic      Multiple      Live Births              Past Medical History:  Diagnosis Date  . Herpes genitalia     Past Surgical History:  Procedure Laterality Date  . WRIST SURGERY      Family History  Family history unknown: Yes    Social History   Tobacco Use  . Smoking status: Former Smoker    Types: Cigars  . Smokeless tobacco: Never Used  . Tobacco comment: 1 Cigar a day   Substance Use Topics  . Alcohol use: Not Currently    Comment: Wine, Last drink: last week   . Drug use: Not Currently    Types: Marijuana    Comment: Every other day use. Last used: at 8am     Allergies: No Known Allergies  Medications Prior to Admission  Medication Sig Dispense Refill Last Dose  . acyclovir (ZOVIRAX) 800 MG tablet Take 1 tablet (800 mg total) by mouth 2 (two) times daily. 10 tablet 0   . Aspirin-Salicylamide-Caffeine (BC HEADACHE POWDER PO) Take 1 packet by mouth daily as needed (headache).     . benzonatate (TESSALON)  100 MG capsule Take 1 capsule (100 mg total) by mouth every 8 (eight) hours. (Patient not taking: Reported on 08/01/2015) 21 capsule 0   . chlorhexidine (PERIDEX) 0.12 % solution 50 mLs by Mouth Rinse route 2 (two) times daily.  0   . hydrocortisone (ANUSOL-HC) 2.5 % rectal cream Place 1 application rectally 2 (two) times daily. 30 g 0   . ibuprofen (ADVIL) 600 MG tablet Take 1 tablet (600 mg total) by mouth every 6 (six) hours as needed. 30 tablet 0   . levonorgestrel (MIRENA) 20 MCG/24HR IUD 1 each by Intrauterine route once.     . metroNIDAZOLE (FLAGYL) 500 MG tablet Take 1 tablet (500 mg total) by mouth 2 (two) times daily. (Patient not taking: Reported on 07/31/2016) 14 tablet 0   . ondansetron (ZOFRAN ODT) 8 MG disintegrating tablet Take 1 tablet (8 mg total) by mouth every 8 (eight) hours as needed for nausea or vomiting. 15 tablet 0   . tiZANidine (ZANAFLEX)  4 MG tablet Take 1-2 tablets (4-8 mg total) by mouth every 6 (six) hours as needed for muscle spasms. 21 tablet 0   . [DISCONTINUED] promethazine (PHENERGAN) 25 MG tablet Take 1 tablet (25 mg total) by mouth every 6 (six) hours as needed for nausea or vomiting. 15 tablet 0     Review of Systems  Constitutional: Negative for chills and fever.  HENT: Negative.   Eyes: Negative for photophobia, pain, discharge, redness and itching.  Respiratory: Negative for cough and chest tightness. Shortness of breath: with ambulation.   Cardiovascular: Positive for palpitations. Negative for chest pain.  Gastrointestinal: Positive for abdominal pain, nausea and vomiting. Negative for constipation and diarrhea.  Genitourinary: Positive for pelvic pain and vaginal bleeding. Negative for dysuria, frequency, urgency and vaginal pain.  Neurological: Negative for seizures, syncope and headaches.  Psychiatric/Behavioral: Negative for confusion and suicidal ideas. The patient is not nervous/anxious.    Physical Exam   Patient Vitals for the past 24 hrs:   BP Temp Temp src Pulse Resp SpO2 Height Weight  01/18/20 1013 (!) 137/95 -- -- 68 -- -- -- --  01/18/20 0959 (!) 138/106 98.4 F (36.9 C) Oral 77 16 100 % 5\' 7"  (1.702 m) 56.9 kg  01/18/20 0852 (!) 150/100 98.6 F (37 C) Oral 63 19 99 % 5\' 7"  (1.702 m) 63.5 kg    Physical Exam Constitutional:      General: She is not in acute distress.    Appearance: She is well-developed.  HENT:     Head: Normocephalic.  Eyes:     Extraocular Movements: Extraocular movements intact.  Cardiovascular:     Rate and Rhythm: Normal rate.  Pulmonary:     Effort: No respiratory distress.  Abdominal:     General: Abdomen is flat.     Palpations: Abdomen is soft.     Tenderness: There is abdominal tenderness in the suprapubic area. There is no right CVA tenderness, left CVA tenderness, guarding or rebound.  Genitourinary:    Vagina: Vaginal discharge and bleeding (scant) present.     Cervix: Cervical motion tenderness present.     Uterus: Tender.      Adnexa:        Right: No mass.       Comments: IUD string no visible and not palpated on exam.  Skin:    General: Skin is warm and dry.  Neurological:     General: No focal deficit present.     Mental Status: She is alert.  Psychiatric:        Mood and Affect: Mood normal.    Results for orders placed or performed during the hospital encounter of 01/18/20 (from the past 24 hour(s))  Urinalysis, Routine w reflex microscopic Urine, Clean Catch     Status: Abnormal   Collection Time: 01/18/20 10:45 AM  Result Value Ref Range   Color, Urine YELLOW YELLOW   APPearance HAZY (A) CLEAR   Specific Gravity, Urine 1.032 (H) 1.005 - 1.030   pH 6.0 5.0 - 8.0   Glucose, UA NEGATIVE NEGATIVE mg/dL   Hgb urine dipstick MODERATE (A) NEGATIVE   Bilirubin Urine NEGATIVE NEGATIVE   Ketones, ur 80 (A) NEGATIVE mg/dL   Protein, ur 03/17/20 (A) NEGATIVE mg/dL   Nitrite NEGATIVE NEGATIVE   Leukocytes,Ua TRACE (A) NEGATIVE   RBC / HPF 6-10 0 - 5 RBC/hpf   WBC, UA 6-10  0 - 5 WBC/hpf   Bacteria, UA RARE (A) NONE SEEN   Squamous  Epithelial / LPF 11-20 0 - 5   Mucus PRESENT   Wet prep, genital     Status: Abnormal   Collection Time: 01/18/20 11:34 AM  Result Value Ref Range   Yeast Wet Prep HPF POC NONE SEEN NONE SEEN   Trich, Wet Prep NONE SEEN NONE SEEN   Clue Cells Wet Prep HPF POC NONE SEEN NONE SEEN   WBC, Wet Prep HPF POC FEW (A) NONE SEEN   Sperm PRESENT   ABO/Rh     Status: None   Collection Time: 01/18/20 11:38 AM  Result Value Ref Range   ABO/RH(D) O POS    No rh immune globuloin      NOT A RH IMMUNE GLOBULIN CANDIDATE, PT RH POSITIVE Performed at [facility] [address] [place], [place] [address]   CBC with Differential/Platelet     Status: Abnormal   Collection Time: 01/18/20 11:38 AM  Result Value Ref Range   WBC 8.9 4.0 - 10.5 K/uL   RBC 4.48 3.87 - 5.11 MIL/uL   Hemoglobin 14.4 12.0 - 15.0 g/dL   HCT 39.1 36.0 - 46.0 %   MCV 87.3 80.0 - 100.0 fL   MCH 32.1 26.0 - 34.0 pg   MCHC 36.8 (H) 30.0 - 36.0 g/dL   RDW 11.9 11.5 - 15.5 %   Platelets 265 150 - 400 K/uL   nRBC 0.0 0.0 - 0.2 %   Neutrophils Relative % 78 %   Neutro Abs 6.9 1.7 - 7.7 K/uL   Lymphocytes Relative 17 %   Lymphs Abs 1.5 0.7 - 4.0 K/uL   Monocytes Relative 5 %   Monocytes Absolute 0.4 0.1 - 1.0 K/uL   Eosinophils Relative 0 %   Eosinophils Absolute 0.0 0.0 - 0.5 K/uL   Basophils Relative 0 %   Basophils Absolute 0.0 0.0 - 0.1 K/uL   Immature Granulocytes 0 %   Abs Immature Granulocytes 0.02 0.00 - 0.07 K/uL  hCG, quantitative, pregnancy     Status: Abnormal   Collection Time: 01/18/20 11:38 AM  Result Value Ref Range   hCG, Beta Chain, Quant, S 68,713 (H) <5 mIU/mL   US OB Comp Less 14 Wks  Result Date: 01/18/2020 CLINICAL DATA:  Vaginal bleed.  Pregnant. EXAM: OBSTETRIC <14 WK ULTRASOUND TECHNIQUE: Transabdominal ultrasound was performed for evaluation of the gestation as well as the maternal uterus and adnexal regions. COMPARISON:  None.  FINDINGS: Intrauterine gestational sac: Single Yolk sac:  Visualized. Embryo:  Visualized. Cardiac Activity: Visualized. Heart Rate: 113 bpm MSD:    mm    w     d CRL:   5.1 mm   6 w 1 d                  [place] EDC: September 11, 2020 Subchorionic hemorrhage:  Small Maternal uterus/adnexae: Corpus luteal cyst is identified in the left ovary measuring 2.9 x 2.9 x 2.1 cm. IUD is not definitely identified in the uterus. IMPRESSION: Single live intrauterine pregnancy measuring to 6 weeks and 1 day. Small subchorionic hemorrhage is noted. IUD is not definitely identified in the uterus. Electronically Signed   By: Abelardo Diesel M.D.   On: 01/18/2020 12:58    MAU Course  Procedures  MDM Care turned over to Laury Deep, CNM @ 12:55 pm. Bhcg and U/S pending.   [place], [place]  01/18/2020, 11:13 AM  Reassessment @ 3474: Reviewed U/S results and BP with patient. She reports the last known  time that she had her IUD strings checked with either 03/2019 or 05/2019 Assessment and Plan  25 y.o. G1P0 @ [redacted]w[redacted]d with pelvic pain and positive pregnancy test.   Vaginal bleeding affecting early pregnancy  - Return to MAU:  If you have heavier bleeding that soaks through more that 2 pads per hour for an hour or more  If you bleed so much that you feel like you might pass out or you do pass out  If you have significant abdominal pain that is not improved with Tylenol 1000 mg every 6 hours as needed for pain  If you develop a fever > 100.5   IUD pregnancy - Unsure if IUD was in place at time of conception - not seen on U/S (01/18/2020)  -Advised to establish [facility] ASAP - Advised that IUD was not seen on U/S and must have fallen out without her knowledge.  [redacted] weeks gestation of pregnancy  Subchorionic hematoma in first trimester, single or unspecified fetus  - Information provided on [facility]   Threatened miscarriage in early pregnancy - Information provided on threatened miscarriage   Chronic hypertension affecting  pregnancy - Rx for Labetalol 100 mg BID - Information provided on cHTN in pregnancy   Morning sickness  - Rx for Reglan 10 mg every 6 hours prn N/V - Rx for Phenergan 25 mg at bedtime  - Discharge patient - Patient verbalized an understanding of the plan of care and agrees.    Raelyn Mora, CNM 01/18/2020 2:26 PM

## 2020-01-18 NOTE — Discharge Instructions (Signed)
Return to MAU:  If you have heavier bleeding that soaks through more that 2 pads per hour for an hour or more  If you bleed so much that you feel like you might pass out or you do pass out  If you have significant abdominal pain that is not improved with Tylenol 1000 mg every 6 hours as needed for pain  If you develop a fever > 100.5   Prenatal Care Providers           Center for [facility] @ [facility]  [address] [phone]  Center for [facility] @ [facility]   [address]  [phone]  [facility] @ [facility]       [address] [phone]            [facility] for [facility] @ [place]     [phone]          [facility] for [facility] @ [place]   [address] [phone]  [facility] for [facility] @ [facility]  [address] [phone]     Center for [facility] @ [facility] [address] [place])  [address]   [phone]     [facility]  Phone: [phone]  [facility]  Phone: [phone]  [facility] Phone: [phone]  [facility] Phone: [phone]  [facility] Phone: [phone]  [facility] Phone: [phone]  [facility]  Phone: [phone]  Safe Medications in Pregnancy   Acne: Benzoyl Peroxide Salicylic Acid  Backache/Headache: Tylenol: 2 regular strength every 4 hours OR              2 Extra strength every 6 hours  Colds/Coughs/Allergies: Benadryl (alcohol free) 25 mg every 6 hours as needed Breath right strips Claritin Cepacol throat lozenges Chloraseptic throat spray Cold-Eeze- up to three times per day Cough drops, alcohol free Flonase (by prescription only) Guaifenesin Mucinex Robitussin DM (plain only, alcohol free) Saline nasal spray/drops Sudafed  (pseudoephedrine) & Actifed ** use only after [redacted] weeks gestation and if you do not have high blood pressure Tylenol Vicks Vaporub Zinc lozenges Zyrtec   Constipation: Colace Ducolax suppositories Fleet enema Glycerin suppositories Metamucil Milk of magnesia Miralax Senokot Smooth move tea  Diarrhea: Kaopectate Imodium A-D  *NO pepto Bismol  Hemorrhoids: Anusol Anusol HC Preparation H Tucks  Indigestion: Tums Maalox Mylanta Zantac  Pepcid  Insomnia: Benadryl (alcohol free) 25mg  every 6 hours as needed Tylenol PM Unisom, no Gelcaps  Leg Cramps: Tums MagGel  Nausea/Vomiting:  Bonine Dramamine Emetrol Ginger extract Sea bands Meclizine  Nausea medication to take during pregnancy:  Unisom (doxylamine succinate 25 mg tablets) Take one tablet daily at bedtime. If symptoms are not adequately controlled, the dose can be increased to a maximum recommended dose of two tablets daily (1/2 tablet in the morning, 1/2 tablet mid-afternoon and one at bedtime). Vitamin B6 100mg  tablets. Take one tablet twice a day (up to 200 mg per day).  Skin Rashes: Aveeno products Benadryl cream or 25mg  every 6 hours as needed Calamine Lotion 1% cortisone cream  Yeast infection: Gyne-lotrimin 7 Monistat 7   **If taking multiple medications, please check labels to avoid duplicating the same active ingredients **take medication as directed on the label ** Do not exceed 4000 mg of tylenol in 24 hours **Do not take medications that contain aspirin or ibuprofen

## 2020-01-18 NOTE — ED Triage Notes (Addendum)
Reports sharp abd pain since last night with vaginal bleeding.  Seen in ED last night and had + preg test.  <1 pad per hour.  Greta Doom, PA to come to triage for [facility] for MAU.

## 2020-01-19 LAB — GC/CHLAMYDIA PROBE AMP (~~LOC~~) NOT AT ARMC
Chlamydia: NEGATIVE
Comment: NEGATIVE
Comment: NORMAL
Neisseria Gonorrhea: NEGATIVE

## 2020-02-13 ENCOUNTER — Other Ambulatory Visit: Payer: Self-pay

## 2020-02-13 DIAGNOSIS — Z3400 Encounter for supervision of normal first pregnancy, unspecified trimester: Secondary | ICD-10-CM | POA: Insufficient documentation

## 2020-02-13 MED ORDER — BLOOD PRESSURE KIT DEVI: 1.0000 | 0 refills | Status: DC

## 2020-02-13 MED ORDER — GNP DIGITAL WEIGHT SCALE MISC: 1.0000 | 0 refills | Status: DC

## 2020-02-16 ENCOUNTER — Ambulatory Visit (INDEPENDENT_AMBULATORY_CARE_PROVIDER_SITE_OTHER): Payer: Self-pay

## 2020-02-16 DIAGNOSIS — Z3401 Encounter for supervision of normal first pregnancy, first trimester: Secondary | ICD-10-CM

## 2020-02-16 DIAGNOSIS — Z3A10 10 weeks gestation of pregnancy: Secondary | ICD-10-CM

## 2020-02-16 DIAGNOSIS — Z3400 Encounter for supervision of normal first pregnancy, unspecified trimester: Secondary | ICD-10-CM

## 2020-02-16 NOTE — Progress Notes (Signed)
  Virtual Visit via Telephone Note  I connected with Mackenzie Allen on 02/16/20 at  9:00 AM EST by telephone and verified that I am speaking with the correct person using two identifiers.  Location: Patient: HOME Provider: [facility]   I discussed the limitations, risks, security and privacy concerns of performing an evaluation and management service by telephone and the availability of in person appointments. I also discussed with the patient that there may be a patient responsible charge related to this service. The patient expressed understanding and agreed to proceed.   History of Present Illness: PRENATAL INTAKE SUMMARY  Ms. Mackenzie Allen presents today New OB Nurse Interview.  OB History    Gravida  1   Para  0   Term      Preterm      AB      Living        SAB      IAB      Ectopic      Multiple      Live Births             I have reviewed the patient's medical, obstetrical, social, and family histories, medications, and available lab results.  SUBJECTIVE She has no unusual complaints   Observations/Objective: Initial nurse interview for history/labs (New OB)  EDD: 09/09/2020 GA: [redacted]w[redacted]d G1 P0 FHT: NA  GENERAL APPEARANCE: oriented to person, place and time  Assessment and Plan: Normal pregnancy Prenatal care: [facility] BP Cuff and Scale ordered Babyscripts downloaded & ordered PHQ-9=1 Labs to be completed at next visit with Sharen Counter on 02/23/2020   Follow Up Instructions:   I discussed the assessment and treatment plan with the patient. The patient was provided an opportunity to ask questions and all were answered. The patient agreed with the plan and demonstrated an understanding of the instructions.   The patient was advised to call back or seek an in-person evaluation if the symptoms worsen or if the condition fails to improve as anticipated.  I provided 15 minutes of non-face-to-face time during this  encounter.   Maretta Bees, RMA

## 2020-02-17 NOTE — Progress Notes (Signed)
Patient was assessed and managed by nursing staff during this encounter. I have reviewed the chart and agree with the documentation and plan. I have also made any necessary editorial changes.  Sharen Counter, CNM 02/17/2020 1:17 PM

## 2020-02-23 ENCOUNTER — Other Ambulatory Visit: Payer: Self-pay

## 2020-02-23 ENCOUNTER — Other Ambulatory Visit (HOSPITAL_COMMUNITY)
Admission: RE | Admit: 2020-02-23 | Discharge: 2020-02-23 | Disposition: A | Discharge status: Home / Self Care | Payer: Medicaid Other | Source: Ambulatory Visit | Attending: Advanced Practice Midwife | Admitting: Advanced Practice Midwife

## 2020-02-23 ENCOUNTER — Ambulatory Visit (INDEPENDENT_AMBULATORY_CARE_PROVIDER_SITE_OTHER): Payer: Self-pay | Admitting: Advanced Practice Midwife

## 2020-02-23 VITALS — BP 108/65 | HR 87 | Wt 128.0 lb

## 2020-02-23 DIAGNOSIS — O10919 Unspecified pre-existing hypertension complicating pregnancy, unspecified trimester: Secondary | ICD-10-CM

## 2020-02-23 DIAGNOSIS — Z3401 Encounter for supervision of normal first pregnancy, first trimester: Secondary | ICD-10-CM | POA: Diagnosis present

## 2020-02-23 DIAGNOSIS — A5901 Trichomonal vulvovaginitis: Secondary | ICD-10-CM

## 2020-02-23 DIAGNOSIS — O23591 Infection of other part of genital tract in pregnancy, first trimester: Secondary | ICD-10-CM

## 2020-02-23 DIAGNOSIS — Z3A11 11 weeks gestation of pregnancy: Secondary | ICD-10-CM

## 2020-02-23 DIAGNOSIS — Z23 Encounter for immunization: Secondary | ICD-10-CM

## 2020-02-23 DIAGNOSIS — O10911 Unspecified pre-existing hypertension complicating pregnancy, first trimester: Secondary | ICD-10-CM

## 2020-02-23 MED ORDER — ASPIRIN 81 MG PO CHEW
81.0000 mg | CHEWABLE_TABLET | Freq: Every day | ORAL | 6 refills | Status: DC
Start: 2020-02-23 — End: 2020-09-07

## 2020-02-23 NOTE — Progress Notes (Signed)
Pt presents today for NOB visit. No complaints.  

## 2020-02-23 NOTE — Progress Notes (Signed)
Subjective:   Mackenzie Allen is a 25 y.o. G1P0 at 9w4dby LMP being seen today for her first obstetrical visit.  Her obstetrical history is significant for None G1, BP elevated in MAU and in 1 previous ED visit but several visits with BP 110s/70s so transient HTN.  and has Chronic hypertension affecting pregnancy; Subchorionic hematoma in first trimester; IUD pregnancy; and Supervision of normal first pregnancy on their problem list.. Patient does intend to breast feed. Pregnancy history fully reviewed.  Patient reports no complaints.  HISTORY: OB History  Gravida Para Term Preterm AB Living  1 0 0 0 0 0  SAB IAB Ectopic Multiple Live Births  0 0 0 0 0    # Outcome Date GA Lbr Len/2nd Weight Sex Delivery Anes PTL Lv  1 Current            Past Medical History:  Diagnosis Date  . Herpes genitalia   . Medical history non-contributory    Past Surgical History:  Procedure Laterality Date  . WRIST SURGERY     Family History  Problem Relation Age of Onset  . Asthma Mother   . Varicose Veins Mother   . Asthma Brother   . Asthma Maternal Grandmother   . Diabetes Maternal Grandmother   . Heart disease Maternal Grandmother   . Hypertension Maternal Grandmother   . Arthritis Paternal Grandmother    Social History   Tobacco Use  . Smoking status: Former Smoker    Types: Cigars  . Smokeless tobacco: Never Used  . Tobacco comment: 1 Cigar a day   Vaping Use  . Vaping Use: Never used  Substance Use Topics  . Alcohol use: Not Currently    Comment: Wine, Last drink: last week   . Drug use: Not Currently    Types: Marijuana    Comment: Every other day use. Last used: at 8am    No Known Allergies Current Outpatient Medications on File Prior to Visit  Medication Sig Dispense Refill  . Blood Pressure Monitoring (BLOOD PRESSURE KIT) DEVI 1 kit by Does not apply route once a week. Check Blood Pressure regularly and record readings into the Babyscripts App.   Large Cuff.  DX O90.0 1 each 0  . labetalol (NORMODYNE) 100 MG tablet Take 1 tablet (100 mg total) by mouth 2 (two) times daily. 60 tablet 6  . Misc. Devices (GNP DIGITAL WEIGHT SCALE) MISC 1 Device by Does not apply route once a week. Check weight for Virtual visits 1 each 0  . Prenatal Vit-Fe Fumarate-FA (MULTIVITAMIN-PRENATAL) 27-0.8 MG TABS tablet Take 1 tablet by mouth daily at 12 noon.    .Marland Kitchenacyclovir (ZOVIRAX) 800 MG tablet Take 1 tablet (800 mg total) by mouth 2 (two) times daily. (Patient not taking: Reported on 02/23/2020) 10 tablet 0  . chlorhexidine (PERIDEX) 0.12 % solution 50 mLs by Mouth Rinse route 2 (two) times daily. (Patient not taking: Reported on 02/23/2020)  0  . levonorgestrel (MIRENA) 20 MCG/24HR IUD 1 each by Intrauterine route once. (Patient not taking: Reported on 02/23/2020)    . metoCLOPramide (REGLAN) 10 MG tablet Take 1 tablet (10 mg total) by mouth every 6 (six) hours. (Patient not taking: Reported on 02/23/2020) 60 tablet 0  . promethazine (PHENERGAN) 25 MG tablet Take 1 tablet (25 mg total) by mouth every 6 (six) hours as needed for nausea or vomiting. (Patient not taking: Reported on 02/23/2020) 60 tablet 0   No current facility-administered medications on  file prior to visit.     Indications for ASA therapy (per uptodate) One of the following: Previous pregnancy with preeclampsia, especially early onset and with an adverse outcome No Multifetal gestation No Chronic hypertension No Type 1 or 2 diabetes mellitus No Chronic kidney disease No Autoimmune disease (antiphospholipid syndrome, systemic lupus erythematosus) No   Two or more of the following: Nulliparity Yes Obesity (body mass index >30 kg/m2) No Family history of preeclampsia in mother or sister No Age ?35 years No Sociodemographic characteristics (African American race, low socioeconomic level) Yes Personal risk factors (eg, previous pregnancy with low birth weight or small for gestational age infant,  previous adverse pregnancy outcome [eg, stillbirth], interval >10 years between pregnancies) No   Indications for early 1 hour GTT (per uptodate)  BMI >25 (>23 in Asian women) AND one of the following  No: BMI 21  Exam   Vitals:   02/23/20 0907  BP: 108/65  Pulse: 87  Weight: 128 lb (58.1 kg)      VS reviewed, nursing note reviewed,  Constitutional: well developed, well nourished, no distress HEENT: normocephalic CV: normal rate Pulm/chest wall: normal effort Breast Exam: Deferred Abdomen: soft Neuro: alert and oriented x 3 Skin: warm, dry Psych: affect normal Pelvic exam: Cervix pink, visually closed, without lesion, scant white creamy discharge, vaginal walls and external genitalia normal    Assessment:   Pregnancy: G1P0 Patient Active Problem List   Diagnosis Date Noted  . Supervision of normal first pregnancy 02/13/2020  . Chronic hypertension affecting pregnancy 01/18/2020  . Subchorionic hematoma in first trimester 01/18/2020  . IUD pregnancy 01/18/2020     Plan:  1. Encounter for supervision of normal first pregnancy in first trimester --Anticipatory guidance about next visits/weeks of pregnancy given. --Next visit in 4 weeks in office for AFP  - CBC/D/Plt+RPR+Rh+ABO+Rub Ab... - Cytology - PAP - Cervicovaginal ancillary only( [place]) - Genetic Screening - Culture, OB Urine - Flu Vaccine QUAD 36+ mos IM (Fluarix, Fluzone & Afluria Quad PF - aspirin 81 MG chewable tablet; Chew 1 tablet (81 mg total) by mouth daily.  Dispense: 30 tablet; Refill: 6  2. [redacted] weeks gestation of pregnancy   3. Chronic hypertension affecting pregnancy --Transient HTN with elevated BP at MAU visit, 108/65 today.   --Watch BP --BASA   Initial labs drawn. Continue prenatal vitamins. Discussed and offered genetic screening options, including Quad screen/AFP, NIPS testing, and option to decline testing. Benefits/risks/alternatives reviewed. Pt aware that anatomy US is  form of genetic screening with lower accuracy in detecting trisomies than blood work.  Pt chooses genetic screening today. NIPS: ordered. Ultrasound discussed; fetal anatomic survey: ordered. Problem list reviewed and updated. The nature of [facility] with multiple MDs and other Advanced Practice Providers was explained to patient; also emphasized that residents, students are part of our team. Routine obstetric precautions reviewed. No follow-ups on file.   Fatima Blank, CNM 02/23/20 5:29 PM

## 2020-02-24 LAB — HCV INTERPRETATION

## 2020-02-24 LAB — CBC/D/PLT+RPR+RH+ABO+RUB AB...
Antibody Screen: NEGATIVE
Basophils Absolute: 0 10*3/uL (ref 0.0–0.2)
Basos: 0 %
EOS (ABSOLUTE): 0 10*3/uL (ref 0.0–0.4)
Eos: 1 %
HCV Ab: <0.1 s/co ratio (ref 0.0–0.9)
HIV Screen 4th Generation wRfx: NONREACTIVE
Hematocrit: 34.3 % (ref 34.0–46.6)
Hemoglobin: 11.6 g/dL (ref 11.1–15.9)
Hepatitis B Surface Ag: NEGATIVE
Immature Grans (Abs): 0 10*3/uL (ref 0.0–0.1)
Immature Granulocytes: 0 %
Lymphocytes Absolute: 1.1 10*3/uL (ref 0.7–3.1)
Lymphs: 16 %
MCH: 30.2 pg (ref 26.6–33.0)
MCHC: 33.8 g/dL (ref 31.5–35.7)
MCV: 89 fL (ref 79–97)
Monocytes Absolute: 0.4 10*3/uL (ref 0.1–0.9)
Monocytes: 6 %
Neutrophils Absolute: 5.6 10*3/uL (ref 1.4–7.0)
Neutrophils: 77 %
Platelets: 281 10*3/uL (ref 150–450)
RBC: 3.84 x10E6/uL (ref 3.77–5.28)
RDW: 12.7 % (ref 11.7–15.4)
RPR Ser Ql: NONREACTIVE
Rh Factor: POSITIVE
Rubella Antibodies, IGG: 3.26 index (ref >0.99)
WBC: 7.2 10*3/uL (ref 3.4–10.8)

## 2020-02-24 LAB — CERVICOVAGINAL ANCILLARY ONLY
Bacterial Vaginitis (gardnerella): POSITIVE — AB
Candida Glabrata: NEGATIVE
Candida Vaginitis: POSITIVE — AB
Chlamydia: NEGATIVE
Comment: NEGATIVE
Comment: NEGATIVE
Comment: NEGATIVE
Comment: NEGATIVE
Comment: NEGATIVE
Comment: NORMAL
Neisseria Gonorrhea: NEGATIVE
Trichomonas: POSITIVE — AB

## 2020-02-24 LAB — CYTOLOGY - PAP: Diagnosis: NEGATIVE

## 2020-02-25 LAB — CULTURE, OB URINE

## 2020-02-25 LAB — URINE CULTURE, OB REFLEX

## 2020-03-01 ENCOUNTER — Encounter: Payer: Self-pay | Admitting: Advanced Practice Midwife

## 2020-03-08 MED ORDER — METRONIDAZOLE 500 MG PO TABS: ORAL_TABLET | ORAL | 1 refills | Status: DC

## 2020-03-08 MED ORDER — METRONIDAZOLE 500 MG PO TABS: 500.0000 mg | ORAL_TABLET | Freq: Two times a day (BID) | ORAL | 0 refills | Status: AC

## 2020-03-08 NOTE — Addendum Note (Signed)
Addended by: Sharen Counter A on: 03/08/2020 01:44 PM   Modules accepted: Orders

## 2020-03-18 ENCOUNTER — Encounter: Payer: Self-pay | Admitting: Advanced Practice Midwife

## 2020-03-22 ENCOUNTER — Ambulatory Visit (INDEPENDENT_AMBULATORY_CARE_PROVIDER_SITE_OTHER): Payer: Self-pay | Admitting: Advanced Practice Midwife

## 2020-03-22 ENCOUNTER — Other Ambulatory Visit: Payer: Self-pay

## 2020-03-22 VITALS — BP 121/73 | HR 90 | Wt 135.4 lb

## 2020-03-22 DIAGNOSIS — Z3A19 19 weeks gestation of pregnancy: Secondary | ICD-10-CM

## 2020-03-22 DIAGNOSIS — O26812 Pregnancy related exhaustion and fatigue, second trimester: Secondary | ICD-10-CM

## 2020-03-22 DIAGNOSIS — Z3401 Encounter for supervision of normal first pregnancy, first trimester: Secondary | ICD-10-CM

## 2020-03-22 DIAGNOSIS — A5901 Trichomonal vulvovaginitis: Secondary | ICD-10-CM

## 2020-03-22 DIAGNOSIS — O23591 Infection of other part of genital tract in pregnancy, first trimester: Secondary | ICD-10-CM

## 2020-03-22 DIAGNOSIS — Z3A15 15 weeks gestation of pregnancy: Secondary | ICD-10-CM

## 2020-03-22 NOTE — Progress Notes (Signed)
   PRENATAL VISIT NOTE  Subjective:  Mackenzie Allen is a 25 y.o. G1P0 at [redacted]w[redacted]d being seen today for ongoing prenatal care.  She is currently monitored for the following issues for this low-risk pregnancy and has Chronic hypertension affecting pregnancy; Subchorionic hematoma in first trimester; IUD pregnancy; and Supervision of normal first pregnancy on their problem list.  Patient reports no complaints.  Contractions: Not present. Vag. Bleeding: None.  Movement: Absent. Denies leaking of fluid.   The following portions of the patient's history were reviewed and updated as appropriate: allergies, current medications, past family history, past medical history, past social history, past surgical history and problem list.   Objective:   Vitals:   03/22/20 1349  BP: 121/73  Pulse: 90  Weight: 135 lb 6.4 oz (61.4 kg)    Fetal Status: Fetal Heart Rate (bpm): 154   Movement: Absent     General:  Alert, oriented and cooperative. Patient is in no acute distress.  Skin: Skin is warm and dry. No rash noted.   Cardiovascular: Normal heart rate noted  Respiratory: Normal respiratory effort, no problems with respiration noted  Abdomen: Soft, gravid, appropriate for gestational age.  Pain/Pressure: Absent     Pelvic: Cervical exam deferred        Extremities: Normal range of motion.  Edema: None  Mental Status: Normal mood and affect. Normal behavior. Normal judgment and thought content.   Assessment and Plan:  Pregnancy: G1P0 at [redacted]w[redacted]d 1. [redacted] weeks gestation of pregnancy - AFP, Serum, Open Spina Bifida  2. Encounter for supervision of normal first pregnancy in first trimester --Anticipatory guidance about next visits/weeks of pregnancy given. --Next visit in 4 weeks in office  - [place] MFM OB COMP + 14 WK; Future  3. Fatigue during pregnancy in second trimester --Hgb was 11.6 at NOB on 02/23/20 but pt with daily fatigue, will recheck today. Pt taking gummy PNV, she will check if it  has iron, if not add iron supplement every other day.  - CBC  4. Trichomonal vaginitis during pregnancy, antepartum, first trimester --Completed tx. Partner also treated.  Too early for TOC today, will do at next visit  Preterm labor symptoms and general obstetric precautions including but not limited to vaginal bleeding, contractions, leaking of fluid and fetal movement were reviewed in detail with the patient. Please refer to After Visit Summary for other counseling recommendations.   No follow-ups on file.  No future appointments.  Sharen Counter, CNM

## 2020-03-22 NOTE — Patient Instructions (Signed)

## 2020-03-24 LAB — AFP, SERUM, OPEN SPINA BIFIDA
AFP MoM: 0.82
AFP Value: 28.7 ng/mL
Gest. Age on Collection Date: 15 weeks
Maternal Age At EDD: 24.6 yr
OSBR Risk 1 IN: 10000
Test Results:: NEGATIVE
Weight: 135 [lb_av]

## 2020-03-24 LAB — CBC
Hematocrit: 29.9 % — ABNORMAL LOW (ref 34.0–46.6)
Hemoglobin: 10 g/dL — ABNORMAL LOW (ref 11.1–15.9)
MCH: 30.7 pg (ref 26.6–33.0)
MCHC: 33.4 g/dL (ref 31.5–35.7)
MCV: 92 fL (ref 79–97)
Platelets: 237 10*3/uL (ref 150–450)
RBC: 3.26 x10E6/uL — ABNORMAL LOW (ref 3.77–5.28)
RDW: 13 % (ref 11.7–15.4)
WBC: 9.1 10*3/uL (ref 3.4–10.8)

## 2020-03-27 ENCOUNTER — Other Ambulatory Visit: Payer: Self-pay | Admitting: Advanced Practice Midwife

## 2020-03-27 ENCOUNTER — Encounter: Payer: Self-pay | Admitting: Advanced Practice Midwife

## 2020-03-27 DIAGNOSIS — O99013 Anemia complicating pregnancy, third trimester: Secondary | ICD-10-CM | POA: Insufficient documentation

## 2020-03-27 MED ORDER — FERROUS SULFATE 325 (65 FE) MG PO TABS: 325.0000 mg | ORAL_TABLET | ORAL | 3 refills | Status: DC

## 2020-03-27 NOTE — Progress Notes (Signed)
Rx for ferrous sulfate every other day in addition to PNV for mild anemia at 28 weeks

## 2020-04-15 ENCOUNTER — Other Ambulatory Visit: Payer: Medicaid Other

## 2020-04-19 ENCOUNTER — Ambulatory Visit (INDEPENDENT_AMBULATORY_CARE_PROVIDER_SITE_OTHER): Payer: Self-pay | Admitting: Advanced Practice Midwife

## 2020-04-19 ENCOUNTER — Other Ambulatory Visit: Payer: Self-pay

## 2020-04-19 ENCOUNTER — Other Ambulatory Visit (HOSPITAL_COMMUNITY)
Admission: RE | Admit: 2020-04-19 | Discharge: 2020-04-19 | Disposition: A | Discharge status: Home / Self Care | Payer: Medicaid Other | Source: Ambulatory Visit | Attending: Advanced Practice Midwife | Admitting: Advanced Practice Midwife

## 2020-04-19 VITALS — BP 109/65 | HR 81 | Wt 136.0 lb

## 2020-04-19 DIAGNOSIS — O23591 Infection of other part of genital tract in pregnancy, first trimester: Secondary | ICD-10-CM

## 2020-04-19 DIAGNOSIS — Z5901 Sheltered homelessness: Secondary | ICD-10-CM | POA: Insufficient documentation

## 2020-04-19 DIAGNOSIS — A5901 Trichomonal vulvovaginitis: Secondary | ICD-10-CM

## 2020-04-19 DIAGNOSIS — Z3A19 19 weeks gestation of pregnancy: Secondary | ICD-10-CM

## 2020-04-19 DIAGNOSIS — Z3401 Encounter for supervision of normal first pregnancy, first trimester: Secondary | ICD-10-CM

## 2020-04-19 NOTE — Patient Instructions (Signed)

## 2020-04-19 NOTE — Progress Notes (Signed)
+   Fetal movement. No movement.

## 2020-04-19 NOTE — Progress Notes (Signed)
   PRENATAL VISIT NOTE  Subjective:  Mackenzie Allen is a 25 y.o. G1P0 at [redacted]w[redacted]d being seen today for ongoing prenatal care.  She is currently monitored for the following issues for this high-risk pregnancy and has Chronic hypertension affecting pregnancy; Subchorionic hematoma in first trimester; IUD pregnancy; Supervision of normal first pregnancy; and Anemia affecting pregnancy in third trimester on their problem list.  Patient reports no complaints.  Contractions: Not present. Vag. Bleeding: None.  Movement: Present. Denies leaking of fluid.   The following portions of the patient's history were reviewed and updated as appropriate: allergies, current medications, past family history, past medical history, past social history, past surgical history and problem list.   Objective:   Vitals:   04/19/20 1404  BP: 109/65  Pulse: 81  Weight: 136 lb (61.7 kg)    Fetal Status:     Movement: Present     General:  Alert, oriented and cooperative. Patient is in no acute distress.  Skin: Skin is warm and dry. No rash noted.   Cardiovascular: Normal heart rate noted  Respiratory: Normal respiratory effort, no problems with respiration noted  Abdomen: Soft, gravid, appropriate for gestational age.  Pain/Pressure: Absent     Pelvic: Cervical exam deferred        Extremities: Normal range of motion.  Edema: None  Mental Status: Normal mood and affect. Normal behavior. Normal judgment and thought content.   Assessment and Plan:  Pregnancy: G1P0 at [redacted]w[redacted]d 1. Trichomonal vaginitis during pregnancy, antepartum, first trimester --TOC today - Cervicovaginal ancillary only( [place])  2. Encounter for supervision of normal first pregnancy in first trimester --Anticipatory guidance about next visits/weeks of pregnancy given. --Next visit in 4 weeks, virtual, then in 8 weeks for GTT  3. [redacted] weeks gestation of pregnancy --[place] tomorrow  Preterm labor symptoms and general obstetric  precautions including but not limited to vaginal bleeding, contractions, leaking of fluid and fetal movement were reviewed in detail with the patient. Please refer to After Visit Summary for other counseling recommendations.   Return in about 4 weeks (around 05/17/2020).  Future Appointments  Date Time Provider Department Center  04/20/2020  8:45 AM [facility]  05/17/2020 10:35 AM [place], [facility] [facility], CNM [facility] None    Sharen Counter, CNM

## 2020-04-20 ENCOUNTER — Other Ambulatory Visit: Payer: Self-pay | Admitting: Advanced Practice Midwife

## 2020-04-20 ENCOUNTER — Ambulatory Visit: Payer: Medicaid Other | Attending: Advanced Practice Midwife

## 2020-04-20 ENCOUNTER — Other Ambulatory Visit: Payer: Self-pay | Admitting: *Deleted

## 2020-04-20 DIAGNOSIS — Z3A19 19 weeks gestation of pregnancy: Secondary | ICD-10-CM

## 2020-04-20 DIAGNOSIS — Z363 Encounter for antenatal screening for malformations: Secondary | ICD-10-CM

## 2020-04-20 DIAGNOSIS — Z3689 Encounter for other specified antenatal screening: Secondary | ICD-10-CM

## 2020-04-20 DIAGNOSIS — Z3401 Encounter for supervision of normal first pregnancy, first trimester: Secondary | ICD-10-CM

## 2020-04-20 DIAGNOSIS — A5901 Trichomonal vulvovaginitis: Secondary | ICD-10-CM

## 2020-04-20 DIAGNOSIS — D649 Anemia, unspecified: Secondary | ICD-10-CM

## 2020-04-20 DIAGNOSIS — O99012 Anemia complicating pregnancy, second trimester: Secondary | ICD-10-CM

## 2020-04-20 DIAGNOSIS — O10012 Pre-existing essential hypertension complicating pregnancy, second trimester: Secondary | ICD-10-CM

## 2020-04-20 DIAGNOSIS — O23591 Infection of other part of genital tract in pregnancy, first trimester: Secondary | ICD-10-CM

## 2020-04-20 LAB — CERVICOVAGINAL ANCILLARY ONLY
Chlamydia: NEGATIVE
Comment: NEGATIVE
Comment: NEGATIVE
Comment: NORMAL
Neisseria Gonorrhea: NEGATIVE
Trichomonas: POSITIVE — AB

## 2020-04-20 MED ORDER — METRONIDAZOLE 500 MG PO TABS: 2000.0000 mg | ORAL_TABLET | Freq: Once | ORAL | 1 refills | Status: AC

## 2020-04-20 NOTE — Progress Notes (Signed)
Tx sent to pharmacy for trichomonas

## 2020-05-17 ENCOUNTER — Telehealth (INDEPENDENT_AMBULATORY_CARE_PROVIDER_SITE_OTHER): Payer: Medicaid Other | Admitting: Advanced Practice Midwife

## 2020-05-17 DIAGNOSIS — Z3A23 23 weeks gestation of pregnancy: Secondary | ICD-10-CM

## 2020-05-17 DIAGNOSIS — O23591 Infection of other part of genital tract in pregnancy, first trimester: Secondary | ICD-10-CM

## 2020-05-17 DIAGNOSIS — D649 Anemia, unspecified: Secondary | ICD-10-CM

## 2020-05-17 DIAGNOSIS — O10919 Unspecified pre-existing hypertension complicating pregnancy, unspecified trimester: Secondary | ICD-10-CM

## 2020-05-17 DIAGNOSIS — Z3401 Encounter for supervision of normal first pregnancy, first trimester: Secondary | ICD-10-CM

## 2020-05-17 DIAGNOSIS — Z8619 Personal history of other infectious and parasitic diseases: Secondary | ICD-10-CM

## 2020-05-17 DIAGNOSIS — O98312 Other infections with a predominantly sexual mode of transmission complicating pregnancy, second trimester: Secondary | ICD-10-CM

## 2020-05-17 DIAGNOSIS — O99012 Anemia complicating pregnancy, second trimester: Secondary | ICD-10-CM

## 2020-05-17 DIAGNOSIS — A5901 Trichomonal vulvovaginitis: Secondary | ICD-10-CM

## 2020-05-17 DIAGNOSIS — O10012 Pre-existing essential hypertension complicating pregnancy, second trimester: Secondary | ICD-10-CM

## 2020-05-17 NOTE — Progress Notes (Signed)
OBSTETRICS PRENATAL VIRTUAL VISIT ENCOUNTER NOTE  Provider location: [facility]   Patient location: Home  I connected with Mackenzie Allen on 05/17/20 at 10:35 AM EDT by MyChart Video Encounter and verified that I am speaking with the correct person using two identifiers. I discussed the limitations, risks, security and privacy concerns of performing an evaluation and management service virtually and the availability of in person appointments. I also discussed with the patient that there may be a patient responsible charge related to this service. The patient expressed understanding and agreed to proceed. Subjective:  Mackenzie Allen is a 25 y.o. G1P0 at [redacted]w[redacted]d being seen today for ongoing prenatal care.  She is currently monitored for the following issues for this high-risk pregnancy and has Chronic hypertension affecting pregnancy; Subchorionic hematoma in first trimester; IUD pregnancy; Supervision of normal first pregnancy; and Anemia affecting pregnancy in third trimester on their problem list.  Patient reports no complaints.   .  .   . Denies any leaking of fluid.   The following portions of the patient's history were reviewed and updated as appropriate: allergies, current medications, past family history, past medical history, past social history, past surgical history and problem list.   Objective:  There were no vitals filed for this visit.  Fetal Status:           General:  Alert, oriented and cooperative. Patient is in no acute distress.  Respiratory: Normal respiratory effort, no problems with respiration noted  Mental Status: Normal mood and affect. Normal behavior. Normal judgment and thought content.  Rest of physical exam deferred due to type of encounter  Imaging: [place] MFM OB DETAIL +14 WK  Result Date: 04/20/2020 ----------------------------------------------------------------------  OBSTETRICS REPORT                        (Signed Final 04/20/2020 10:56 am) ---------------------------------------------------------------------- Patient Info  ID #:       [id number]                          D.O.B.:  November 10, 1995 (25 yrs)  Name:       Mackenzie Allen                  Visit Date: 04/20/2020 09:01 am              [place] ---------------------------------------------------------------------- Performed By  Attending:        Lin Landsman      Secondary Phy.:   [facility]                    MD  Performed By:     Emeline Darling BS,      Address:          [address]  [address]                                                             [place]                                                             [id number]  Referred By:      Curtis Sites-       [facility]         [facility] CNM                                [facility] at                                                             [facility]  Ref. Address:     [address]                    [address]                    Jacky Kindle ---------------------------------------------------------------------- Orders  #  Description                           Code        Ordered By  1  [place] MFM OB DETAIL +14 WK               [id number]    Mataio Mele LEFTWICH-                                                       Craige Cotta ----------------------------------------------------------------------  #  Order #                     Accession #                Episode #  1  [id number]                   [id number]                 [id number] ---------------------------------------------------------------------- Indications  [redacted] weeks gestation of  pregnancy                Z3A.19  Encounter for antenatal screening for          Z36.3  malformations  Low Risk  NIPS(Negative AFP)  Anemia during pregnancy in second trimester    O99.012  Hypertension - Chronic/Pre-existing on         O10.019  Labetalol ---------------------------------------------------------------------- Fetal Evaluation  Num Of Fetuses:         1  Fetal Heart Rate(bpm):  143  Cardiac Activity:       Observed  Presentation:           Cephalic  Placenta:               Anterior  P. Cord Insertion:      Visualized  Amniotic Fluid  AFI FV:      Within normal limits                              Largest Pocket(cm)                              5.5 ---------------------------------------------------------------------- Biometry  BPD:      45.5  mm     G. Age:  19w 5d         52  %    CI:        71.68   %    70 - 86                                                          FL/HC:      19.2   %    16.8 - 19.8  HC:      171.1  mm     G. Age:  19w 5d         42  %    HC/AC:      1.14        1.09 - 1.39  AC:      150.6  mm     G. Age:  20w 2d         65  %    FL/BPD:     72.3   %  FL:       32.9  mm     G. Age:  20w 2d         63  %    FL/AC:      21.8   %    20 - 24  HUM:      31.7  mm     G. Age:  20w 4d         76  %  CER:      19.7  mm     G. Age:  19w 1d         42  %  NFT:       5.1  mm  LV:        7.1  mm  CM:        3.9  mm  Est. FW:     339  gm    0 lb 12 oz  74  % ---------------------------------------------------------------------- OB History  Gravidity:    1         Term:   0        Prem:   0        SAB:   0  TOP:          0       Ectopic:  0        Living: 0 ---------------------------------------------------------------------- Gestational Age  LMP:           19w 5d        Date:  12/04/19                 EDD:   09/09/20  U/S Today:     20w 0d                                        EDD:   09/07/20  Best:          19w 5d     Det. By:  LMP  (12/04/19)          EDD:   09/09/20 ---------------------------------------------------------------------- Anatomy  Cranium:               Appears normal         Aortic  Arch:            Appears normal  Cavum:                 Appears normal         Ductal Arch:            Appears normal  Ventricles:            Appears normal         Diaphragm:              Appears normal  Choroid Plexus:        Appears normal         Stomach:                Appears normal, left                                                                        sided  Cerebellum:            Appears normal         Abdomen:                Appears normal  Posterior Fossa:       Appears normal         Abdominal Wall:         Appears nml (cord                                                                        insert, abd wall)  Nuchal Fold:  Appears normal         Cord Vessels:           Appears normal (3                                                                        vessel cord)  Face:                  Appears normal         Kidneys:                Appear normal                         (orbits and profile)  Lips:                  Appears normal         Bladder:                Appears normal  Thoracic:              Appears normal         Spine:                  Appears normal  Heart:                 Appears normal         Upper Extremities:      Appears normal                         (4CH, axis, and                         situs)  RVOT:                  Appears normal         Lower Extremities:      Appears normal  LVOT:                  Appears normal  Other:  Fetus appears to be a female. Heels visualized. Technically difficult due          to fetal position. ---------------------------------------------------------------------- Cervix Uterus Adnexa  Cervix  Length:              3  cm.  Normal appearance by transabdominal scan.  Right Ovary  Within normal limits.  Left Ovary  Within normal limits.  Adnexa  No abnormality visualized. ---------------------------------------------------------------------- Impression  Single intrauterine pregnancy here for a detailed anatomy  due to chronic  hypertension on Labetalol 100 mg BID.  Normal anatomy with measurements consistent with dates  There is good fetal movement and amniotic fluid volume  I reviewed today's study and recommend serial growth, and  initiation of weekly testing at 32 weeks. We discussed the  increased risk for fetal growth restriction, preeclampsia,  placental abruption, maternal stroke and, stillbirth and  hospitalization with and without preterm delivery. I discussed  maintaining blood pressure between < 150/105 mmHg and if  her blood pressure persist beyond these values we would  recommend increasing her blood pressure  therapy.  I also  counseled her or the signs and symptoms of preeclampsia.  In addition we discussed the value of low dose ASA for the  prevention of preeclampsia, which she is taking. ---------------------------------------------------------------------- Recommendations  Follow up growth scheduled in 4 weeks. ----------------------------------------------------------------------               Lin Landsmanorenthian Booker, MD Electronically Signed Final Report   04/20/2020 10:56 am ----------------------------------------------------------------------   Assessment and Plan:  Pregnancy: G1P0 at 3571w4d 1. [redacted] weeks gestation of pregnancy   2. Encounter for supervision of normal first pregnancy in first trimester --Anticipatory guidance about next visits/weeks of pregnancy given. --Next visit in 4 weeks in office for GTT and TOC trichomonas  3. Trichomonal vaginitis during pregnancy, antepartum, first trimester --TOC at next visit  4. Chronic hypertension affecting pregnancy --Taking labetalol 100 mg daily --BP today was 111/75 on home cuff --No s/sx of PEC  5. History of herpes genitalis --Pt reported today that she had a single outbreak of HSV in 2017  --Valtrex at 36 weeks for prevention --Discussed risks of HSV for vaginal delivery, pt with single outbreak, no s/sx since so low risk of transmission if on  prophylaxis with no symptoms at delivery  Preterm labor symptoms and general obstetric precautions including but not limited to vaginal bleeding, contractions, leaking of fluid and fetal movement were reviewed in detail with the patient. I discussed the assessment and treatment plan with the patient. The patient was provided an opportunity to ask questions and all were answered. The patient agreed with the plan and demonstrated an understanding of the instructions. The patient was advised to call back or seek an in-person office evaluation/[facility] for any urgent or concerning symptoms. Please refer to After Visit Summary for other counseling recommendations.   I provided 10 minutes of face-to-face time during this encounter.  Return in about 4 weeks (around 06/14/2020).  Future Appointments  Date Time Provider Department Center  05/21/2020  9:00 AM [facility]  05/21/2020  9:15 AM [facility] US2 [facility]    Sharen CounterLisa Leftwich-Kirby, CNM [facility], [facility]

## 2020-05-21 ENCOUNTER — Ambulatory Visit: Payer: Medicaid Other | Admitting: *Deleted

## 2020-05-21 ENCOUNTER — Other Ambulatory Visit: Payer: Self-pay

## 2020-05-21 ENCOUNTER — Ambulatory Visit: Payer: Medicaid Other | Attending: Maternal & Fetal Medicine

## 2020-05-21 ENCOUNTER — Encounter: Payer: Self-pay | Admitting: *Deleted

## 2020-05-21 VITALS — BP 111/65 | HR 67

## 2020-05-21 DIAGNOSIS — Z3689 Encounter for other specified antenatal screening: Secondary | ICD-10-CM | POA: Insufficient documentation

## 2020-05-21 DIAGNOSIS — O10912 Unspecified pre-existing hypertension complicating pregnancy, second trimester: Secondary | ICD-10-CM

## 2020-05-21 DIAGNOSIS — D649 Anemia, unspecified: Secondary | ICD-10-CM

## 2020-05-21 DIAGNOSIS — O99012 Anemia complicating pregnancy, second trimester: Secondary | ICD-10-CM

## 2020-05-21 DIAGNOSIS — Z3A24 24 weeks gestation of pregnancy: Secondary | ICD-10-CM

## 2020-05-21 DIAGNOSIS — O10012 Pre-existing essential hypertension complicating pregnancy, second trimester: Secondary | ICD-10-CM

## 2020-05-21 DIAGNOSIS — Z363 Encounter for antenatal screening for malformations: Secondary | ICD-10-CM

## 2020-05-24 ENCOUNTER — Other Ambulatory Visit: Payer: Self-pay | Admitting: *Deleted

## 2020-05-24 DIAGNOSIS — O10912 Unspecified pre-existing hypertension complicating pregnancy, second trimester: Secondary | ICD-10-CM

## 2020-05-27 ENCOUNTER — Telehealth: Payer: Self-pay

## 2020-05-27 NOTE — Telephone Encounter (Signed)
Received message from [employer] regarding a elevated blood pressure of 114/94. I called patient to assess. Patient did not answer. Left HIPPA compliant vm for patient to return call to the office.

## 2020-06-15 ENCOUNTER — Other Ambulatory Visit: Payer: Medicaid Other

## 2020-06-15 ENCOUNTER — Encounter: Payer: Medicaid Other | Admitting: Advanced Practice Midwife

## 2020-06-18 ENCOUNTER — Ambulatory Visit: Payer: Medicaid Other | Attending: Obstetrics

## 2020-06-18 ENCOUNTER — Ambulatory Visit: Payer: Medicaid Other | Admitting: *Deleted

## 2020-06-18 ENCOUNTER — Other Ambulatory Visit: Payer: Self-pay | Admitting: *Deleted

## 2020-06-18 ENCOUNTER — Encounter: Payer: Self-pay | Admitting: *Deleted

## 2020-06-18 ENCOUNTER — Other Ambulatory Visit: Payer: Self-pay

## 2020-06-18 VITALS — BP 117/74 | HR 82

## 2020-06-18 DIAGNOSIS — Z3A28 28 weeks gestation of pregnancy: Secondary | ICD-10-CM

## 2020-06-18 DIAGNOSIS — O10913 Unspecified pre-existing hypertension complicating pregnancy, third trimester: Secondary | ICD-10-CM | POA: Diagnosis present

## 2020-06-18 DIAGNOSIS — D649 Anemia, unspecified: Secondary | ICD-10-CM

## 2020-06-18 DIAGNOSIS — O10912 Unspecified pre-existing hypertension complicating pregnancy, second trimester: Secondary | ICD-10-CM | POA: Insufficient documentation

## 2020-06-18 DIAGNOSIS — O99013 Anemia complicating pregnancy, third trimester: Secondary | ICD-10-CM

## 2020-06-18 DIAGNOSIS — Z362 Encounter for other antenatal screening follow-up: Secondary | ICD-10-CM

## 2020-06-18 DIAGNOSIS — O10013 Pre-existing essential hypertension complicating pregnancy, third trimester: Secondary | ICD-10-CM

## 2020-06-25 ENCOUNTER — Other Ambulatory Visit: Payer: Medicaid Other

## 2020-06-25 ENCOUNTER — Encounter: Payer: Self-pay | Admitting: Nurse Practitioner

## 2020-06-25 ENCOUNTER — Other Ambulatory Visit: Payer: Self-pay

## 2020-06-25 ENCOUNTER — Encounter: Payer: Self-pay | Admitting: Obstetrics

## 2020-06-25 ENCOUNTER — Ambulatory Visit (INDEPENDENT_AMBULATORY_CARE_PROVIDER_SITE_OTHER): Payer: Medicaid Other | Admitting: Nurse Practitioner

## 2020-06-25 VITALS — BP 111/77 | HR 87 | Wt 162.0 lb

## 2020-06-25 DIAGNOSIS — O219 Vomiting of pregnancy, unspecified: Secondary | ICD-10-CM

## 2020-06-25 DIAGNOSIS — O10913 Unspecified pre-existing hypertension complicating pregnancy, third trimester: Secondary | ICD-10-CM

## 2020-06-25 DIAGNOSIS — Z3403 Encounter for supervision of normal first pregnancy, third trimester: Secondary | ICD-10-CM

## 2020-06-25 DIAGNOSIS — Z3A29 29 weeks gestation of pregnancy: Secondary | ICD-10-CM

## 2020-06-25 DIAGNOSIS — Z23 Encounter for immunization: Secondary | ICD-10-CM

## 2020-06-25 DIAGNOSIS — O10919 Unspecified pre-existing hypertension complicating pregnancy, unspecified trimester: Secondary | ICD-10-CM

## 2020-06-25 MED ORDER — PROMETHAZINE HCL 25 MG PO TABS: 25.0000 mg | ORAL_TABLET | Freq: Four times a day (QID) | ORAL | 0 refills | Status: DC | PRN

## 2020-06-25 NOTE — Progress Notes (Signed)
    Subjective:  Mackenzie Allen is a 25 y.o. G1P0 at [redacted]w[redacted]d being seen today for ongoing prenatal care.  She is currently monitored for the following issues for this low-risk pregnancy and has Chronic hypertension affecting pregnancy; Subchorionic hematoma in first trimester; IUD pregnancy; Supervision of normal first pregnancy; Anemia affecting pregnancy in third trimester; and History of herpes genitalis on their problem list.  Patient reports  vomited glucola today before one hour collection - test cancelled.  Has not been having problems with vomiting lately .  Contractions: Not present. Vag. Bleeding: None.  Movement: Present. Denies leaking of fluid.   The following portions of the patient's history were reviewed and updated as appropriate: allergies, current medications, past family history, past medical history, past social history, past surgical history and problem list. Problem list updated.  Objective:   Vitals:   06/25/20 1023  BP: 111/77  Pulse: 87  Weight: 162 lb (73.5 kg)    Fetal Status: Fetal Heart Rate (bpm): 152   Movement: Present     General:  Alert, oriented and cooperative. Patient is in no acute distress.  Skin: Skin is warm and dry. No rash noted.   Cardiovascular: Normal heart rate noted  Respiratory: Normal respiratory effort, no problems with respiration noted  Abdomen: Soft, gravid, appropriate for gestational age. Pain/Pressure: Present     Pelvic:  Cervical exam deferred        Extremities: Normal range of motion.  Edema: Trace  Mental Status: Normal mood and affect. Normal behavior. Normal judgment and thought content.   Urinalysis:      Assessment and Plan:  Pregnancy: G1P0 at [redacted]w[redacted]d  1. Encounter for supervision of normal first pregnancy in third trimester Vomited glucola so test was cancelled. Will schedule to return next week and have her take Phenergan before she leaves home - RX sent to her pharmacy Wanted note from the office  to take to work - knows she is not being taken out of work due to a medical issue, but her work did request a note from the office.  Gave her a note saying that she wanted to not work right now due to the smell of alcohol and food bothering her.  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 TDAP done today.  - CBC - HIV antibody (with reflex) - RPR  2. Vomiting affecting pregnancy, antepartum Vomiting has improved very much and has not been vomiting at home but then did vomit glucola  3.  Chronic hypertension Taking baby aspirin and labetalol - took her medication today  Preterm labor symptoms and general obstetric precautions including but not limited to vaginal bleeding, contractions, leaking of fluid and fetal movement were reviewed in detail with the patient. Please refer to After Visit Summary for other counseling recommendations.  Return in about 1 week (around 07/02/2020) for lab only visit for early AM glucola and in 2 weeks for in person ROB.  Nolene Bernheim, RN, MSN, NP-BC Nurse Practitioner, [facility] for [employer], [facility] 06/25/2020 10:53 AM

## 2020-06-25 NOTE — Progress Notes (Signed)
ROB 29.1wks GTT today Labs pended TDAP offered and accepted Depression and anxiety screen negative.  Wants a note to be taken out of work.

## 2020-06-26 LAB — CBC
Hematocrit: 31.7 % — ABNORMAL LOW (ref 34.0–46.6)
Hemoglobin: 10.4 g/dL — ABNORMAL LOW (ref 11.1–15.9)
MCH: 30.6 pg (ref 26.6–33.0)
MCHC: 32.8 g/dL (ref 31.5–35.7)
MCV: 93 fL (ref 79–97)
Platelets: 210 10*3/uL (ref 150–450)
RBC: 3.4 x10E6/uL — ABNORMAL LOW (ref 3.77–5.28)
RDW: 12 % (ref 11.7–15.4)
WBC: 7.7 10*3/uL (ref 3.4–10.8)

## 2020-06-26 LAB — HIV ANTIBODY (ROUTINE TESTING W REFLEX): HIV Screen 4th Generation wRfx: NONREACTIVE

## 2020-06-26 LAB — RPR: RPR Ser Ql: NONREACTIVE

## 2020-07-02 ENCOUNTER — Other Ambulatory Visit: Payer: Medicaid Other

## 2020-07-02 ENCOUNTER — Other Ambulatory Visit: Payer: Self-pay

## 2020-07-02 DIAGNOSIS — Z3403 Encounter for supervision of normal first pregnancy, third trimester: Secondary | ICD-10-CM

## 2020-07-03 LAB — GLUCOSE TOLERANCE, 2 HOURS W/ 1HR
Glucose, 1 hour: 107 mg/dL (ref 65–179)
Glucose, 2 hour: 90 mg/dL (ref 65–152)
Glucose, Fasting: 80 mg/dL (ref 65–91)

## 2020-07-08 ENCOUNTER — Other Ambulatory Visit: Payer: Self-pay

## 2020-07-08 ENCOUNTER — Other Ambulatory Visit (HOSPITAL_COMMUNITY)
Admission: RE | Admit: 2020-07-08 | Discharge: 2020-07-08 | Disposition: A | Discharge status: Home / Self Care | Payer: Medicaid Other | Source: Ambulatory Visit

## 2020-07-08 ENCOUNTER — Ambulatory Visit (INDEPENDENT_AMBULATORY_CARE_PROVIDER_SITE_OTHER): Payer: Medicaid Other

## 2020-07-08 VITALS — BP 115/74 | HR 100 | Wt 164.0 lb

## 2020-07-08 DIAGNOSIS — Z3403 Encounter for supervision of normal first pregnancy, third trimester: Secondary | ICD-10-CM | POA: Diagnosis present

## 2020-07-08 DIAGNOSIS — O23591 Infection of other part of genital tract in pregnancy, first trimester: Secondary | ICD-10-CM

## 2020-07-08 DIAGNOSIS — Z3A31 31 weeks gestation of pregnancy: Secondary | ICD-10-CM

## 2020-07-08 DIAGNOSIS — A5901 Trichomonal vulvovaginitis: Secondary | ICD-10-CM | POA: Insufficient documentation

## 2020-07-08 DIAGNOSIS — O10919 Unspecified pre-existing hypertension complicating pregnancy, unspecified trimester: Secondary | ICD-10-CM

## 2020-07-08 NOTE — Progress Notes (Signed)
   HIGH-RISK PREGNANCY OFFICE VISIT  Patient name: Mackenzie Allen MRN [id number]  Date of birth: [birth year] Chief Complaint:   Routine Prenatal Visit  Subjective:   Mackenzie Allen is a 25 y.o. G1P0 female at [redacted]w[redacted]d with an Estimated Date of Delivery: 09/09/20 being seen today for ongoing management of a high-risk pregnancy aeb has Chronic hypertension affecting pregnancy; IUD pregnancy; Supervision of normal first pregnancy; Anemia affecting pregnancy in third trimester; and History of herpes genitalis on their problem list.  Patient presents today with no complaints. Patient reports some mild pressure. Patient endorses fetal movement. Patient denies abdominal cramping or contractions.  Patient denies vaginal concerns including abnormal discharge, leaking of fluid, and bleeding.  Contractions: Not present. Vag. Bleeding: None.  Movement: Present.  Reviewed past medical,surgical, social, obstetrical and family history as well as problem list, medications and allergies.  Objective   Vitals:   07/08/20 1351  BP: 115/74  Pulse: 100  Weight: 164 lb (74.4 kg)  Body mass index is 25.69 kg/m.  Total Weight Gain:24 lb (10.9 kg)         Physical Examination:   General appearance: Well appearing, and in no distress  Mental status: Alert, oriented to person, place, and time  Skin: Warm & dry  Cardiovascular: Normal heart rate noted  Respiratory: Normal respiratory effort, no distress  Abdomen: Soft, gravid, nontender, AGA with Fundal Height: 30 cm  Pelvic: Cervical exam deferred           Extremities:    Fetal Status: Fetal Heart Rate (bpm): 145  Movement: Present   No results found for this or any previous visit (from the past 24 hour(s)).  Assessment & Plan:  High-risk pregnancy of a 25 y.o., G1P0 at [redacted]w[redacted]d with an Estimated Date of Delivery: 09/09/20   1. Encounter for supervision of normal first pregnancy in third trimester -Anticipatory guidance for  upcoming appts. -Patient to schedule next appt in 2-3 weeks for an in-person visit.  2. Trichomonal vaginitis during pregnancy, antepartum, first trimester -TOC completed today. -Informed that if abnormal provider will send treatment with one refill for EPT.  Patient verbalizes understanding.  3. Chronic hypertension affecting pregnancy -Taking labetalol 100mg  BID. -BP normotensive- -Taking bASA as prescribed.   4. [redacted] weeks gestation of pregnancy -Doing well.    Meds: No orders of the defined types were placed in this encounter.  Labs/procedures today:  Lab Orders  No laboratory test(s) ordered today     Reviewed: Preterm labor symptoms and general obstetric precautions including but not limited to vaginal bleeding, contractions, leaking of fluid and fetal movement were reviewed in detail with the patient.  All questions were answered.  Follow-up: No follow-ups on file.  No orders of the defined types were placed in this encounter.  MSN, CNM 07/08/2020

## 2020-07-08 NOTE — Addendum Note (Signed)
Addended by: Marya Landry D on: 07/08/2020 02:12 PM   Modules accepted: Orders

## 2020-07-09 LAB — CERVICOVAGINAL ANCILLARY ONLY
Comment: NEGATIVE
Trichomonas: POSITIVE — AB

## 2020-07-10 MED ORDER — METRONIDAZOLE 500 MG PO TABS: 2000.0000 mg | ORAL_TABLET | Freq: Once | ORAL | 1 refills | Status: AC

## 2020-07-10 NOTE — Addendum Note (Signed)
Addended by: Gerrit Heck L on: 07/10/2020 01:01 AM   Modules accepted: Orders

## 2020-07-16 ENCOUNTER — Ambulatory Visit: Payer: Medicaid Other | Admitting: *Deleted

## 2020-07-16 ENCOUNTER — Ambulatory Visit: Payer: Medicaid Other | Attending: Obstetrics

## 2020-07-16 ENCOUNTER — Other Ambulatory Visit: Payer: Self-pay

## 2020-07-16 ENCOUNTER — Other Ambulatory Visit: Payer: Self-pay | Admitting: *Deleted

## 2020-07-16 VITALS — BP 119/72 | HR 84

## 2020-07-16 DIAGNOSIS — D649 Anemia, unspecified: Secondary | ICD-10-CM

## 2020-07-16 DIAGNOSIS — O10913 Unspecified pre-existing hypertension complicating pregnancy, third trimester: Secondary | ICD-10-CM | POA: Insufficient documentation

## 2020-07-16 DIAGNOSIS — O99013 Anemia complicating pregnancy, third trimester: Secondary | ICD-10-CM

## 2020-07-16 DIAGNOSIS — O10013 Pre-existing essential hypertension complicating pregnancy, third trimester: Secondary | ICD-10-CM

## 2020-07-16 DIAGNOSIS — Z3A32 32 weeks gestation of pregnancy: Secondary | ICD-10-CM

## 2020-07-22 ENCOUNTER — Ambulatory Visit: Payer: Medicaid Other | Admitting: *Deleted

## 2020-07-22 ENCOUNTER — Other Ambulatory Visit: Payer: Self-pay

## 2020-07-22 ENCOUNTER — Telehealth (INDEPENDENT_AMBULATORY_CARE_PROVIDER_SITE_OTHER): Payer: Medicaid Other | Admitting: Advanced Practice Midwife

## 2020-07-22 ENCOUNTER — Ambulatory Visit (INDEPENDENT_AMBULATORY_CARE_PROVIDER_SITE_OTHER): Payer: Self-pay

## 2020-07-22 VITALS — BP 117/76 | HR 83 | Wt 167.1 lb

## 2020-07-22 VITALS — BP 117/76 | HR 83

## 2020-07-22 DIAGNOSIS — O10919 Unspecified pre-existing hypertension complicating pregnancy, unspecified trimester: Secondary | ICD-10-CM

## 2020-07-22 DIAGNOSIS — O23593 Infection of other part of genital tract in pregnancy, third trimester: Secondary | ICD-10-CM

## 2020-07-22 DIAGNOSIS — O10913 Unspecified pre-existing hypertension complicating pregnancy, third trimester: Secondary | ICD-10-CM

## 2020-07-22 DIAGNOSIS — O0993 Supervision of high risk pregnancy, unspecified, third trimester: Secondary | ICD-10-CM

## 2020-07-22 DIAGNOSIS — Z3A33 33 weeks gestation of pregnancy: Secondary | ICD-10-CM

## 2020-07-22 DIAGNOSIS — O98313 Other infections with a predominantly sexual mode of transmission complicating pregnancy, third trimester: Secondary | ICD-10-CM

## 2020-07-22 DIAGNOSIS — A5901 Trichomonal vulvovaginitis: Secondary | ICD-10-CM | POA: Insufficient documentation

## 2020-07-22 NOTE — Progress Notes (Signed)
OBSTETRICS PRENATAL VIRTUAL VISIT ENCOUNTER NOTE  Provider location: [facility]   Patient location: Home  I connected with Mackenzie Allen on 07/22/20 at  1:20 PM EDT by MyChart Video Encounter and verified that I am speaking with the correct person using two identifiers. I discussed the limitations, risks, security and privacy concerns of performing an evaluation and management service virtually and the availability of in person appointments. I also discussed with the patient that there may be a patient responsible charge related to this service. The patient expressed understanding and agreed to proceed. Subjective:  Mackenzie Allen is a 25 y.o. G1P0 at [redacted]w[redacted]d being seen today for ongoing prenatal care.  She is currently monitored for the following issues for this high-risk pregnancy and has Chronic hypertension affecting pregnancy; IUD pregnancy; Supervision of normal first pregnancy; Anemia affecting pregnancy in third trimester; and History of herpes genitalis on their problem list.  Patient reports no complaints.  Contractions: Not present. Vag. Bleeding: None.  Movement: Present. Denies any leaking of fluid.   The following portions of the patient's history were reviewed and updated as appropriate: allergies, current medications, past family history, past medical history, past social history, past surgical history and problem list.   Objective:   Vitals:   07/22/20 1302  BP: 117/76  Pulse: 83    Fetal Status:     Movement: Present     General:  Alert, oriented and cooperative. Patient is in no acute distress.  Respiratory: Normal respiratory effort, no problems with respiration noted  Mental Status: Normal mood and affect. Normal behavior. Normal judgment and thought content.  Rest of physical exam deferred due to type of encounter  Imaging: [place] MFM FETAL BPP WO NON STRESS  Result Date:  07/16/2020 ----------------------------------------------------------------------  OBSTETRICS REPORT                       (Signed Final 07/16/2020 11:58 am) ---------------------------------------------------------------------- Patient Info  ID #:       [id number]                          D.O.B.:  1995/10/10 (25 yrs)  Name:       Mackenzie Allen                  Visit Date: 07/16/2020 09:52 am              Nand ---------------------------------------------------------------------- Performed By  Attending:        Ma Rings MD         Secondary Phy.:   [facility]  Performed By:     Mackenzie Allen BS,      Address:          [address]                                                             [address]  [address]                                                             [place]                                                             [address]  Referred By:      Mackenzie Allen-       [facility]                    Mackenzie Allen                                [facility] at                                                             [facility]  Ref. Address:     [address]                    [address]                    Jacky Kindle ---------------------------------------------------------------------- Orders  #  Description                           Code        Ordered By  1  [place] MFM FETAL BPP WO NON               [id number]    YU FANG     STRESS  2  [place] MFM OB FOLLOW UP                   [id number]    YU FANG ----------------------------------------------------------------------  #  Order #                     Accession #                Episode #  1  [id number]                   [id number]                 [id number]  2  [id number]                   [id number]  [id number]  ---------------------------------------------------------------------- Indications  Hypertension - Chronic/Pre-existing on         O10.019  Labetalol  Low Risk NIPS(Negative AFP)  [redacted] weeks gestation of pregnancy                Z3A.32  Anemia during pregnancy in third trimester     O99.013 ---------------------------------------------------------------------- Fetal Evaluation  Num Of Fetuses:         1  Fetal Heart Rate(bpm):  143  Cardiac Activity:       Observed  Presentation:           Cephalic  Placenta:               Anterior  P. Cord Insertion:      Previously Visualized  Amniotic Fluid  AFI FV:      Within normal limits  AFI Sum(cm)     %Tile       Largest Pocket(cm)  13.85           46          4.78  RUQ(cm)       RLQ(cm)       LUQ(cm)        LLQ(cm)  1.58          3.12          4.78           4.37 ---------------------------------------------------------------------- Biophysical Evaluation  Amniotic F.V:   Pocket => 2 cm             F. Tone:        Observed  F. Movement:    Observed                   Score:          8/8  F. Breathing:   Observed ---------------------------------------------------------------------- Biometry  BPD:      78.7  mm     G. Age:  31w 4d         25  %    CI:        73.97   %    70 - 86                                                          FL/HC:      21.4   %    19.1 - 21.3  HC:      290.6  mm     G. Age:  32w 0d         13  %    HC/AC:      1.04        0.96 - 1.17  AC:      278.8  mm     G. Age:  32w 0d         42  %    FL/BPD:     78.9   %    71 - 87  FL:       62.1  mm     G. Age:  32w 1d         38  %    FL/AC:      22.3   %    20 - 24  LV:  6.7  mm  Est. FW:    1878  gm      4 lb 2 oz     34  % ---------------------------------------------------------------------- OB History  Gravidity:    1         Term:   0        Prem:   0        SAB:   0  TOP:          0       Ectopic:  0        Living: 0 ----------------------------------------------------------------------  Gestational Age  LMP:           32w 1d        Date:  12/04/19                 EDD:   09/09/20  U/S Today:     32w 0d                                        EDD:   09/10/20  Best:          32w 1d     Det. By:  LMP  (12/04/19)          EDD:   09/09/20 ---------------------------------------------------------------------- Anatomy  Cranium:               Appears normal         Aortic Arch:            Appears normal  Cavum:                 Appears normal         Ductal Arch:            Previously seen  Ventricles:            Appears normal         Diaphragm:              Previously seen  Choroid Plexus:        Previously seen        Stomach:                Appears normal, left                                                                        sided  Cerebellum:            Previously seen        Abdomen:                Appears normal  Posterior Fossa:       Previously seen        Abdominal Wall:         Previously seen  Nuchal Fold:           Previously seen        Cord Vessels:           Appears normal (3  vessel cord)  Face:                  Orbits and profile     Kidneys:                Appear normal                         previously seen  Lips:                  Previously seen        Bladder:                Appears normal  Thoracic:              Appears normal         Spine:                  Previously seen  Heart:                 Appears normal         Upper Extremities:      Previously seen                         (4CH, axis, and                         situs)  RVOT:                  Previously seen        Lower Extremities:      Previously seen  LVOT:                  Appears normal  Other:  Fetus appears to be a female. Heels prev.  visualized. ---------------------------------------------------------------------- Cervix Uterus Adnexa  Cervix  Not visualized (advanced GA >24wks)  ---------------------------------------------------------------------- Comments  This patient was seen for a follow up growth scan due to  chronic hypertension treated with labetalol.  She denies any  problems since her last exam.  She was informed that the fetal growth and amniotic fluid  level appears appropriate for her gestational age.  A biophysical profile performed today was 8 out of 8.  A follow-up growth scan was scheduled in 4 weeks.  She  should continue weekly biophysical profiles until delivery. ----------------------------------------------------------------------                   Mackenzie Rings, MD Electronically Signed Final Report   07/16/2020 11:58 am ----------------------------------------------------------------------  [place] MFM OB FOLLOW UP  Result Date: 07/16/2020 ----------------------------------------------------------------------  OBSTETRICS REPORT                       (Signed Final 07/16/2020 11:58 am) ---------------------------------------------------------------------- Patient Info  ID #:       [id number]                          D.O.B.:  [birth year] (25 yrs)  Name:       Torsha Allen                  Visit Date: 07/16/2020 09:52 am              [place] ---------------------------------------------------------------------- Performed By  Attending:        Ma Rings MD  Secondary Phy.:   [facility]  Performed By:     Mackenzie Norriearolyn Isley BS,      Address:          [address]                    [address]                                                             [address]                                                             [address]                                                             [place]                                                             [id number]  Referred By:      Mackenzie SitesLISA A LEFTWICH-       [facility]                    Mackenzie Allen                                [facility] at                                                              [facility]  Ref. Address:     [address]                    [place] ---------------------------------------------------------------------- Orders  #  Description                           Code        Ordered  By  1  [place] MFM FETAL BPP WO NON               76819.01    YU FANG     STRESS  2  [place] MFM OB FOLLOW UP                   [id number]    YU FANG ----------------------------------------------------------------------  #  Order #                     Accession #                Episode #  1  [id number]                   [id number]                 [id number]  2  [id number]                   [id number]                 [id number] ---------------------------------------------------------------------- Indications  Hypertension - Chronic/Pre-existing on         O10.019  Labetalol  Low Risk NIPS(Negative AFP)  [redacted] weeks gestation of pregnancy                Z3A.32  Anemia during pregnancy in third trimester     O99.013 ---------------------------------------------------------------------- Fetal Evaluation  Num Of Fetuses:         1  Fetal Heart Rate(bpm):  143  Cardiac Activity:       Observed  Presentation:           Cephalic  Placenta:               Anterior  P. Cord Insertion:      Previously Visualized  Amniotic Fluid  AFI FV:      Within normal limits  AFI Sum(cm)     %Tile       Largest Pocket(cm)  13.85           46          4.78  RUQ(cm)       RLQ(cm)       LUQ(cm)        LLQ(cm)  1.58          3.12          4.78           4.37 ---------------------------------------------------------------------- Biophysical Evaluation  Amniotic F.V:   Pocket => 2 cm             F. Tone:        Observed  F. Movement:    Observed                   Score:          8/8  F. Breathing:   Observed ---------------------------------------------------------------------- Biometry  BPD:      78.7  mm     G. Age:  31w 4d         25  %    CI:         73.97   %    70 - 86  FL/HC:      21.4   %    19.1 - 21.3  HC:      290.6  mm     G. Age:  32w 0d         13  %    HC/AC:      1.04        0.96 - 1.17  AC:      278.8  mm     G. Age:  32w 0d         42  %    FL/BPD:     78.9   %    71 - 87  FL:       62.1  mm     G. Age:  32w 1d         38  %    FL/AC:      22.3   %    20 - 24  LV:        6.7  mm  Est. FW:    1878  gm      4 lb 2 oz     34  % ---------------------------------------------------------------------- OB History  Gravidity:    1         Term:   0        Prem:   0        SAB:   0  TOP:          0       Ectopic:  0        Living: 0 ---------------------------------------------------------------------- Gestational Age  LMP:           32w 1d        Date:  12/04/19                 EDD:   09/09/20  U/S Today:     32w 0d                                        EDD:   09/10/20  Best:          32w 1d     Det. By:  LMP  (12/04/19)          EDD:   09/09/20 ---------------------------------------------------------------------- Anatomy  Cranium:               Appears normal         Aortic Arch:            Appears normal  Cavum:                 Appears normal         Ductal Arch:            Previously seen  Ventricles:            Appears normal         Diaphragm:              Previously seen  Choroid Plexus:        Previously seen        Stomach:                Appears normal, left  sided  Cerebellum:            Previously seen        Abdomen:                Appears normal  Posterior Fossa:       Previously seen        Abdominal Wall:         Previously seen  Nuchal Fold:           Previously seen        Cord Vessels:           Appears normal (3                                                                        vessel cord)  Face:                  Orbits and profile     Kidneys:                Appear normal                         previously  seen  Lips:                  Previously seen        Bladder:                Appears normal  Thoracic:              Appears normal         Spine:                  Previously seen  Heart:                 Appears normal         Upper Extremities:      Previously seen                         (4CH, axis, and                         situs)  RVOT:                  Previously seen        Lower Extremities:      Previously seen  LVOT:                  Appears normal  Other:  Fetus appears to be a female. Heels prev.  visualized. ---------------------------------------------------------------------- Cervix Uterus Adnexa  Cervix  Not visualized (advanced GA >24wks) ---------------------------------------------------------------------- Comments  This patient was seen for a follow up growth scan due to  chronic hypertension treated with labetalol.  She denies any  problems since her last exam.  She was informed that the fetal growth and amniotic fluid  level appears appropriate for her gestational age.  A biophysical profile performed today was 8 out of 8.  A follow-up growth scan was scheduled in 4 weeks.  She  should continue weekly biophysical profiles until delivery. ----------------------------------------------------------------------  Mackenzie Rings, MD Electronically Signed Final Report   07/16/2020 11:58 am ----------------------------------------------------------------------   Assessment and Plan:  Pregnancy: G1P0 at [redacted]w[redacted]d 1. Encounter for supervision of high risk pregnancy in third trimester --Pt reports good fetal movement, denies cramping, LOF, or vaginal bleeding --Anticipatory guidance about next visits/weeks of pregnancy given. --Next visit in 2 weeks  2. [redacted] weeks gestation of pregnancy   3. Trichomonal vaginitis in pregnancy, third trimester --Tx x 3 in pregnancy. Pt denies intercourse since treatment.   --Last treatment 6/23, pt to come in next week for self swab for TOC --If  positive consider alternative treatments (IV Flagyl, Tinidazole)  4. CHTN  --On labetalol, BP wnl, no s/sx of PEC  Preterm labor symptoms and general obstetric precautions including but not limited to vaginal bleeding, contractions, leaking of fluid and fetal movement were reviewed in detail with the patient. I discussed the assessment and treatment plan with the patient. The patient was provided an opportunity to ask questions and all were answered. The patient agreed with the plan and demonstrated an understanding of the instructions. The patient was advised to call back or seek an in-person office evaluation/go to [facility]U at [facility] for any urgent or concerning symptoms. Please refer to After Visit Summary for other counseling recommendations.   I provided 10 minutes of face-to-face time during this encounter.  Return in about 2 weeks (around 08/05/2020).  Future Appointments  Date Time Provider Department Center  07/29/2020 10:15 AM [facility] NST [facility]  08/05/2020 10:15 AM [facility] NST [facility]  08/13/2020 10:45 AM [facility] NURSE [facility]  08/13/2020 11:00 AM [facility] US1 [facility]  08/19/2020  9:15 AM [facility] NST [facility]  08/26/2020  9:15 AM [facility] NST [facility]    Sharen Counter, [facility] [employer], [facility]

## 2020-07-22 NOTE — Progress Notes (Signed)
Patient seen and assessed by nursing staff.  Agree with documentation and plan. ? ? ?NST:  Baseline: 140 bpm, Variability: Good {> 6 bpm), Accelerations: Reactive, and Decelerations: Absent ? ?

## 2020-07-22 NOTE — Progress Notes (Signed)

## 2020-07-29 ENCOUNTER — Ambulatory Visit: Payer: Medicaid Other

## 2020-07-29 ENCOUNTER — Other Ambulatory Visit: Payer: Self-pay

## 2020-07-29 ENCOUNTER — Ambulatory Visit (INDEPENDENT_AMBULATORY_CARE_PROVIDER_SITE_OTHER): Payer: Self-pay

## 2020-07-29 ENCOUNTER — Ambulatory Visit: Payer: Medicaid Other | Admitting: *Deleted

## 2020-07-29 VITALS — BP 107/71 | HR 91 | Wt 165.6 lb

## 2020-07-29 DIAGNOSIS — O10919 Unspecified pre-existing hypertension complicating pregnancy, unspecified trimester: Secondary | ICD-10-CM

## 2020-07-29 NOTE — Progress Notes (Signed)

## 2020-08-05 ENCOUNTER — Ambulatory Visit: Payer: Self-pay | Admitting: *Deleted

## 2020-08-05 ENCOUNTER — Ambulatory Visit (INDEPENDENT_AMBULATORY_CARE_PROVIDER_SITE_OTHER): Payer: Self-pay

## 2020-08-05 ENCOUNTER — Other Ambulatory Visit: Payer: Self-pay

## 2020-08-05 ENCOUNTER — Ambulatory Visit (INDEPENDENT_AMBULATORY_CARE_PROVIDER_SITE_OTHER): Payer: Medicaid Other | Admitting: Obstetrics & Gynecology

## 2020-08-05 VITALS — BP 108/69 | HR 86 | Wt 167.8 lb

## 2020-08-05 VITALS — BP 115/72 | HR 83 | Wt 167.8 lb

## 2020-08-05 DIAGNOSIS — O10919 Unspecified pre-existing hypertension complicating pregnancy, unspecified trimester: Secondary | ICD-10-CM

## 2020-08-05 DIAGNOSIS — O99013 Anemia complicating pregnancy, third trimester: Secondary | ICD-10-CM

## 2020-08-05 DIAGNOSIS — Z3403 Encounter for supervision of normal first pregnancy, third trimester: Secondary | ICD-10-CM

## 2020-08-05 DIAGNOSIS — A5901 Trichomonal vulvovaginitis: Secondary | ICD-10-CM

## 2020-08-05 DIAGNOSIS — O23593 Infection of other part of genital tract in pregnancy, third trimester: Secondary | ICD-10-CM

## 2020-08-05 DIAGNOSIS — Z8619 Personal history of other infectious and parasitic diseases: Secondary | ICD-10-CM

## 2020-08-05 MED ORDER — VALACYCLOVIR HCL 500 MG PO TABS: 500.0000 mg | ORAL_TABLET | Freq: Every day | ORAL | 0 refills | Status: AC

## 2020-08-05 NOTE — Progress Notes (Signed)
    Subjective:  Mackenzie Allen is a 25 y.o. G1P0 at [redacted]w[redacted]d being seen today for ongoing prenatal care.  She is currently monitored for the following issues for this high-risk pregnancy and has Chronic hypertension affecting pregnancy; IUD pregnancy; Supervision of normal first pregnancy; Anemia affecting pregnancy in third trimester; History of herpes genitalis; and Trichomonal vaginitis in pregnancy, third trimester on their problem list.  Patient reports backache on occasion. Otherwise doing well. BP at home usually SBP 100-110, DBP 70's.  Contractions: Not present. Vag. Bleeding: None.  Movement: Present. Denies leaking of fluid.   The following portions of the patient's history were reviewed and updated as appropriate: allergies, current medications, past family history, past medical history, past social history, past surgical history and problem list.   Objective:   Vitals:   08/05/20 1503  BP: 115/72  Pulse: 83  Weight: 167 lb 12.8 oz (76.1 kg)    Fetal Status: Fetal Heart Rate (bpm): 142   Movement: Present     General:  Alert, oriented and cooperative. Patient is in no acute distress.  Skin: Skin is warm and dry. No rash noted.   Cardiovascular: Normal heart rate noted  Respiratory: Normal respiratory effort, no problems with respiration noted  Abdomen: Soft, gravid, appropriate for gestational age. Pain/Pressure: Present     Pelvic:  Cervical exam deferred        Extremities: Normal range of motion.  Edema: None  Mental Status: Normal mood and affect. Normal behavior. Normal judgment and thought content.    Assessment and Plan:  Pregnancy: G1P0 at [redacted]w[redacted]d, doing well.   1. Encounter for supervision of normal first pregnancy in third trimester Monitoring as below. Doing well.   2. Chronic hypertension affecting pregnancy Well controlled on labetalol 100mg  BID in office and at home. BPP/NST awaiting results from today. Cont weekly monitoring, growth U/S on  7/29.   3. Trichomonal vaginitis in pregnancy, third trimester Treated x 2, most recently on 6/23. Will obain TOC next week with GBC/gc/ch swabs. Asymptomatic and partner treated.   4. History of herpes genitalis Distant history, no current lesions. Rx'd valtrex to start at 36 weeks.   5. Anemia affecting pregnancy in third trimester Hgb 10.4 on 06/25/2020. Taking ferrous sulfate every other day, tolerating well. Cont regimen as is.   Preterm labor symptoms and general obstetric precautions including but not limited to vaginal bleeding, contractions, leaking of fluid and fetal movement were reviewed in detail with the patient. Please refer to After Visit Summary for other counseling recommendations.  No follow-ups on file.   08/25/2020, DO

## 2020-08-05 NOTE — Progress Notes (Signed)
Pt reports fetal movement with some pressure and hip pain.

## 2020-08-05 NOTE — Progress Notes (Signed)
Pt informed that the ultrasound is considered a limited OB ultrasound and is not intended to be a complete ultrasound exam.  Patient also informed that the ultrasound is not being completed with the intent of assessing for fetal or placental anomalies or any pelvic abnormalities.  Explained that the purpose of today's ultrasound is to assess for presentation, BPP and amniotic fluid volume.  Patient acknowledges the purpose of the exam and the limitations of the study.    [place] for growth and BPP @ MFM on 7/29.

## 2020-08-09 ENCOUNTER — Other Ambulatory Visit: Payer: Medicaid Other

## 2020-08-13 ENCOUNTER — Ambulatory Visit (INDEPENDENT_AMBULATORY_CARE_PROVIDER_SITE_OTHER): Payer: Medicaid Other | Admitting: Obstetrics & Gynecology

## 2020-08-13 ENCOUNTER — Ambulatory Visit: Payer: Medicaid Other | Attending: Obstetrics

## 2020-08-13 ENCOUNTER — Ambulatory Visit: Payer: Medicaid Other | Admitting: *Deleted

## 2020-08-13 ENCOUNTER — Other Ambulatory Visit: Payer: Self-pay

## 2020-08-13 ENCOUNTER — Other Ambulatory Visit (HOSPITAL_COMMUNITY)
Admission: RE | Admit: 2020-08-13 | Discharge: 2020-08-13 | Disposition: A | Discharge status: Home / Self Care | Payer: Medicaid Other | Source: Ambulatory Visit | Attending: Obstetrics & Gynecology | Admitting: Obstetrics & Gynecology

## 2020-08-13 VITALS — BP 117/82 | HR 78 | Wt 170.9 lb

## 2020-08-13 VITALS — BP 115/74 | HR 87

## 2020-08-13 DIAGNOSIS — Z3A36 36 weeks gestation of pregnancy: Secondary | ICD-10-CM

## 2020-08-13 DIAGNOSIS — O10013 Pre-existing essential hypertension complicating pregnancy, third trimester: Secondary | ICD-10-CM | POA: Diagnosis not present

## 2020-08-13 DIAGNOSIS — Z3400 Encounter for supervision of normal first pregnancy, unspecified trimester: Secondary | ICD-10-CM | POA: Insufficient documentation

## 2020-08-13 DIAGNOSIS — O10919 Unspecified pre-existing hypertension complicating pregnancy, unspecified trimester: Secondary | ICD-10-CM

## 2020-08-13 DIAGNOSIS — Z3403 Encounter for supervision of normal first pregnancy, third trimester: Secondary | ICD-10-CM | POA: Diagnosis present

## 2020-08-13 DIAGNOSIS — O10913 Unspecified pre-existing hypertension complicating pregnancy, third trimester: Secondary | ICD-10-CM | POA: Insufficient documentation

## 2020-08-13 DIAGNOSIS — O99013 Anemia complicating pregnancy, third trimester: Secondary | ICD-10-CM

## 2020-08-13 DIAGNOSIS — D649 Anemia, unspecified: Secondary | ICD-10-CM | POA: Diagnosis not present

## 2020-08-13 NOTE — Progress Notes (Signed)
ROB 36 wks For GBS, GC/CC today, added trich for history.

## 2020-08-13 NOTE — Progress Notes (Signed)
   PRENATAL VISIT NOTE  Subjective:  Mackenzie Allen is a 25 y.o. G1P0 at [redacted]w[redacted]d being seen today for ongoing prenatal care.  She is currently monitored for the following issues for this high-risk pregnancy and has Chronic hypertension affecting pregnancy; IUD pregnancy; Supervision of normal first pregnancy; Anemia affecting pregnancy in third trimester; History of herpes genitalis; and Trichomonal vaginitis in pregnancy, third trimester on their problem list.  Patient reports occasional contractions.  Contractions: Irritability. Vag. Bleeding: None.  Movement: Present. Denies leaking of fluid.   The following portions of the patient's history were reviewed and updated as appropriate: allergies, current medications, past family history, past medical history, past social history, past surgical history and problem list.   Objective:   Vitals:   08/13/20 0815  BP: 117/82  Pulse: 78  Weight: 170 lb 14.4 oz (77.5 kg)    Fetal Status: Fetal Heart Rate (bpm): 134   Movement: Present     General:  Alert, oriented and cooperative. Patient is in no acute distress.  Skin: Skin is warm and dry. No rash noted.   Cardiovascular: Normal heart rate noted  Respiratory: Normal respiratory effort, no problems with respiration noted  Abdomen: Soft, gravid, appropriate for gestational age.  Pain/Pressure: Present     Pelvic: Cervical exam performed in the presence of a chaperone        Extremities: Normal range of motion.  Edema: None  Mental Status: Normal mood and affect. Normal behavior. Normal judgment and thought content.   Assessment and Plan:  Pregnancy: G1P0 at [redacted]w[redacted]d 1. Supervision of normal first pregnancy, antepartum Routine screen - Strep Gp B NAA - Cervicovaginal ancillary only( [facility])  2. Chronic hypertension affecting pregnancy BP control is good  Preterm labor symptoms and general obstetric precautions including but not limited to vaginal bleeding, contractions,  leaking of fluid and fetal movement were reviewed in detail with the patient. Please refer to After Visit Summary for other counseling recommendations.   Return in about 1 week (around 08/20/2020).  Future Appointments  Date Time Provider Department Center  08/13/2020 10:45 AM [facility] NURSE [facility]  08/13/2020 11:00 AM [facility] US1 [facility]  08/19/2020  9:15 AM [facility] NST [facility]  08/23/2020  1:50 PM Adam Phenix, MD [facility] None  08/26/2020  9:15 AM [facility] NST [facility]    Scheryl Darter, MD

## 2020-08-15 LAB — STREP GP B NAA: Strep Gp B NAA: POSITIVE — AB

## 2020-08-16 ENCOUNTER — Other Ambulatory Visit: Payer: Self-pay

## 2020-08-16 LAB — CERVICOVAGINAL ANCILLARY ONLY
Chlamydia: NEGATIVE
Comment: NEGATIVE
Comment: NEGATIVE
Comment: NORMAL
Neisseria Gonorrhea: NEGATIVE
Trichomonas: POSITIVE — AB

## 2020-08-19 ENCOUNTER — Other Ambulatory Visit: Payer: Self-pay

## 2020-08-19 ENCOUNTER — Ambulatory Visit (INDEPENDENT_AMBULATORY_CARE_PROVIDER_SITE_OTHER): Payer: Medicaid Other

## 2020-08-19 ENCOUNTER — Other Ambulatory Visit: Payer: Self-pay | Admitting: Obstetrics & Gynecology

## 2020-08-19 ENCOUNTER — Ambulatory Visit: Payer: Medicaid Other | Admitting: *Deleted

## 2020-08-19 VITALS — BP 112/75 | HR 89

## 2020-08-19 DIAGNOSIS — A5901 Trichomonal vulvovaginitis: Secondary | ICD-10-CM

## 2020-08-19 DIAGNOSIS — O23593 Infection of other part of genital tract in pregnancy, third trimester: Secondary | ICD-10-CM

## 2020-08-19 DIAGNOSIS — O10919 Unspecified pre-existing hypertension complicating pregnancy, unspecified trimester: Secondary | ICD-10-CM

## 2020-08-19 MED ORDER — METRONIDAZOLE 500 MG PO TABS: ORAL_TABLET | ORAL | 0 refills | Status: DC

## 2020-08-19 MED ORDER — LABETALOL HCL 100 MG PO TABS
100.0000 mg | ORAL_TABLET | Freq: Two times a day (BID) | ORAL | 2 refills | Status: DC
Start: 2020-08-19 — End: 2020-09-07

## 2020-08-19 NOTE — Progress Notes (Signed)
Meds ordered this encounter  Medications  . metroNIDAZOLE (FLAGYL) 500 MG tablet    Sig: Take two tablets by mouth twice a day, for one day.  Or you can take all four tablets at once if you can tolerate it.    Dispense:  4 tablet    Refill:  0    

## 2020-08-19 NOTE — Progress Notes (Signed)
Pt sent MyChart message needing RF of medication. Rx sent.

## 2020-08-23 ENCOUNTER — Ambulatory Visit (INDEPENDENT_AMBULATORY_CARE_PROVIDER_SITE_OTHER): Payer: Medicaid Other | Admitting: Obstetrics & Gynecology

## 2020-08-23 ENCOUNTER — Encounter: Payer: Self-pay | Admitting: Obstetrics & Gynecology

## 2020-08-23 ENCOUNTER — Other Ambulatory Visit: Payer: Self-pay

## 2020-08-23 VITALS — BP 121/83 | HR 93 | Wt 173.0 lb

## 2020-08-23 DIAGNOSIS — Z3403 Encounter for supervision of normal first pregnancy, third trimester: Secondary | ICD-10-CM

## 2020-08-23 DIAGNOSIS — Z8619 Personal history of other infectious and parasitic diseases: Secondary | ICD-10-CM

## 2020-08-23 DIAGNOSIS — A5901 Trichomonal vulvovaginitis: Secondary | ICD-10-CM

## 2020-08-23 DIAGNOSIS — O99013 Anemia complicating pregnancy, third trimester: Secondary | ICD-10-CM

## 2020-08-23 DIAGNOSIS — O23593 Infection of other part of genital tract in pregnancy, third trimester: Secondary | ICD-10-CM

## 2020-08-23 DIAGNOSIS — O10919 Unspecified pre-existing hypertension complicating pregnancy, unspecified trimester: Secondary | ICD-10-CM

## 2020-08-23 NOTE — Progress Notes (Signed)
   PRENATAL VISIT NOTE  Subjective:  Mackenzie Allen is a 25 y.o. G1P0 at [redacted]w[redacted]d being seen today for ongoing prenatal care.  She is currently monitored for the following issues for this high-risk pregnancy and has Chronic hypertension affecting pregnancy; IUD pregnancy; Supervision of normal first pregnancy; Anemia affecting pregnancy in third trimester; History of herpes genitalis; and Trichomonal vaginitis in pregnancy, third trimester on their problem list.  Patient reports no complaints.  Contractions: Irregular. Vag. Bleeding: None.  Movement: Present. Denies leaking of fluid.   The following portions of the patient's history were reviewed and updated as appropriate: allergies, current medications, past family history, past medical history, past social history, past surgical history and problem list.   Objective:   Vitals:   08/23/20 1352  BP: 121/83  Pulse: 93  Weight: 173 lb (78.5 kg)    Fetal Status: Fetal Heart Rate (bpm): 145   Movement: Present     General:  Alert, oriented and cooperative. Patient is in no acute distress.  Skin: Skin is warm and dry. No rash noted.   Cardiovascular: Normal heart rate noted  Respiratory: Normal respiratory effort, no problems with respiration noted  Abdomen: Soft, gravid, appropriate for gestational age.  Pain/Pressure: Present     Pelvic: Cervical exam deferred        Extremities: Normal range of motion.     Mental Status: Normal mood and affect. Normal behavior. Normal judgment and thought content.   Assessment and Plan:  Pregnancy: G1P0 at [redacted]w[redacted]d 1. Encounter for supervision of normal first pregnancy in third trimester - Continue routine care - GBS done at last visit and positive - PCN in labor (NKDA)  2. Anemia affecting pregnancy in third trimester - Continue Iron every other day  3. History of herpes genitalis - Start anti-viral suppression until delivery  4. Trichomonal vaginitis in pregnancy, third  trimester - Partner has been treated and recently tested negative. No other partners.  - Pt is s/p third therapy course - Flagyl 2g. Would suggest re-test after delivery at [facility] visit  5. Chronic hypertension affecting pregnancy - Discussed S/Sx of PreE/SIPE. She declines there symptoms at this time.  - She will continue labetalol BP wnl today.  - Discussed timing of delivery at approx 39w - Continue BPP/NST weekly  Term labor symptoms and general obstetric precautions including but not limited to vaginal bleeding, contractions, leaking of fluid and fetal movement were reviewed in detail with the patient. Please refer to After Visit Summary for other counseling recommendations.   Return in about 1 week (around 08/30/2020) for OB VISIT, MD only.  Future Appointments  Date Time Provider Department Center  08/26/2020  9:15 AM [facility]  08/30/2020  9:55 AM Brock Bad, MD [facility] None    Milas Hock, MD

## 2020-08-26 ENCOUNTER — Ambulatory Visit: Payer: Medicaid Other | Admitting: *Deleted

## 2020-08-26 ENCOUNTER — Other Ambulatory Visit: Payer: Self-pay

## 2020-08-26 ENCOUNTER — Ambulatory Visit (INDEPENDENT_AMBULATORY_CARE_PROVIDER_SITE_OTHER): Payer: Medicaid Other

## 2020-08-26 VITALS — BP 115/73 | HR 80 | Wt 174.3 lb

## 2020-08-26 DIAGNOSIS — Z3A38 38 weeks gestation of pregnancy: Secondary | ICD-10-CM | POA: Diagnosis not present

## 2020-08-26 DIAGNOSIS — O10919 Unspecified pre-existing hypertension complicating pregnancy, unspecified trimester: Secondary | ICD-10-CM

## 2020-08-26 NOTE — Progress Notes (Signed)

## 2020-08-30 ENCOUNTER — Encounter (HOSPITAL_COMMUNITY): Payer: Self-pay | Admitting: *Deleted

## 2020-08-30 ENCOUNTER — Encounter (HOSPITAL_COMMUNITY): Payer: Self-pay

## 2020-08-30 ENCOUNTER — Encounter: Payer: Self-pay | Admitting: Obstetrics

## 2020-08-30 ENCOUNTER — Ambulatory Visit (INDEPENDENT_AMBULATORY_CARE_PROVIDER_SITE_OTHER): Payer: Medicaid Other | Admitting: Obstetrics

## 2020-08-30 ENCOUNTER — Telehealth (HOSPITAL_COMMUNITY): Payer: Self-pay | Admitting: *Deleted

## 2020-08-30 ENCOUNTER — Other Ambulatory Visit: Payer: Self-pay

## 2020-08-30 VITALS — BP 120/81 | HR 79 | Wt 172.5 lb

## 2020-08-30 DIAGNOSIS — O23593 Infection of other part of genital tract in pregnancy, third trimester: Secondary | ICD-10-CM

## 2020-08-30 DIAGNOSIS — O10919 Unspecified pre-existing hypertension complicating pregnancy, unspecified trimester: Secondary | ICD-10-CM

## 2020-08-30 DIAGNOSIS — O0993 Supervision of high risk pregnancy, unspecified, third trimester: Secondary | ICD-10-CM

## 2020-08-30 DIAGNOSIS — A5901 Trichomonal vulvovaginitis: Secondary | ICD-10-CM

## 2020-08-30 DIAGNOSIS — O99013 Anemia complicating pregnancy, third trimester: Secondary | ICD-10-CM

## 2020-08-30 NOTE — Telephone Encounter (Signed)
Preadmission screen  

## 2020-08-30 NOTE — Progress Notes (Signed)
Subjective:  [name] is a 25 y.o. G1P0 at [redacted]w[redacted]d being seen today for ongoing prenatal care.  She is currently monitored for the following issues for this high-risk pregnancy and has Chronic hypertension affecting pregnancy; IUD pregnancy; Supervision of normal first pregnancy; Anemia affecting pregnancy in third trimester; History of herpes genitalis; and Trichomonal vaginitis in pregnancy, third trimester on their problem list.  Patient reports no complaints.  Contractions: Irritability. Vag. Bleeding: None.  Movement: Present. Denies leaking of fluid.   The following portions of the patient's history were reviewed and updated as appropriate: allergies, current medications, past family history, past medical history, past social history, past surgical history and problem list. Problem list updated.  Objective:   Vitals:   08/30/20 1016  BP: 120/81  Pulse: 79  Weight: 172 lb 8 oz (78.2 kg)    Fetal Status:     Movement: Present     General:  Alert, oriented and cooperative. Patient is in no acute distress.  Skin: Skin is warm and dry. No rash noted.   Cardiovascular: Normal heart rate noted  Respiratory: Normal respiratory effort, no problems with respiration noted  Abdomen: Soft, gravid, appropriate for gestational age. Pain/Pressure: Present     Pelvic:  Cervical exam deferred        Extremities: Normal range of motion.     Mental Status: Normal mood and affect. Normal behavior. Normal judgment and thought content.   Urinalysis:      Assessment and Plan:  Pregnancy: G1P0 at [redacted]w[redacted]d  1. Supervision of high risk pregnancy in third trimester  2. Chronic hypertension affecting pregnancy - BP clinically stable - ultrasound for interval growth and AFI normal - IOL at 38 - 39.6 weeks  3. Anemia affecting pregnancy in third trimester - taking iron  4. Trichomonal vaginitis in pregnancy, third trimester, treated - repeat TOC postpartum   Term labor symptoms  and general obstetric precautions including but not limited to vaginal bleeding, contractions, leaking of fluid and fetal movement were reviewed in detail with the patient. Please refer to After Visit Summary for other counseling recommendations.   Return in about 2 weeks (around 09/13/2020) for postpartum visit.   Brock Bad, MD  08/30/20

## 2020-09-01 ENCOUNTER — Other Ambulatory Visit: Payer: Self-pay | Admitting: Advanced Practice Midwife

## 2020-09-03 ENCOUNTER — Other Ambulatory Visit: Payer: Self-pay | Admitting: Obstetrics and Gynecology

## 2020-09-03 LAB — SARS CORONAVIRUS 2 (TAT 6-24 HRS): SARS Coronavirus 2: NEGATIVE

## 2020-09-05 ENCOUNTER — Inpatient Hospital Stay (HOSPITAL_COMMUNITY)
Admission: AD | Admit: 2020-09-05 | Discharge: 2020-09-07 | DRG: 806 | Disposition: A | Discharge status: Home / Self Care | Payer: Medicaid Other | Attending: Obstetrics and Gynecology | Admitting: Obstetrics and Gynecology

## 2020-09-05 ENCOUNTER — Other Ambulatory Visit: Payer: Self-pay

## 2020-09-05 ENCOUNTER — Encounter (HOSPITAL_COMMUNITY): Payer: Self-pay | Admitting: Obstetrics and Gynecology

## 2020-09-05 ENCOUNTER — Inpatient Hospital Stay (HOSPITAL_COMMUNITY): Payer: Medicaid Other

## 2020-09-05 DIAGNOSIS — Z3A39 39 weeks gestation of pregnancy: Secondary | ICD-10-CM | POA: Diagnosis not present

## 2020-09-05 DIAGNOSIS — I10 Essential (primary) hypertension: Secondary | ICD-10-CM | POA: Diagnosis present

## 2020-09-05 DIAGNOSIS — A6000 Herpesviral infection of urogenital system, unspecified: Secondary | ICD-10-CM | POA: Diagnosis present

## 2020-09-05 DIAGNOSIS — O99824 Streptococcus B carrier state complicating childbirth: Secondary | ICD-10-CM | POA: Diagnosis present

## 2020-09-05 DIAGNOSIS — Z7982 Long term (current) use of aspirin: Secondary | ICD-10-CM | POA: Diagnosis not present

## 2020-09-05 DIAGNOSIS — O1002 Pre-existing essential hypertension complicating childbirth: Secondary | ICD-10-CM | POA: Diagnosis present

## 2020-09-05 DIAGNOSIS — Z87891 Personal history of nicotine dependence: Secondary | ICD-10-CM

## 2020-09-05 DIAGNOSIS — O99820 Streptococcus B carrier state complicating pregnancy: Secondary | ICD-10-CM | POA: Diagnosis not present

## 2020-09-05 DIAGNOSIS — O9832 Other infections with a predominantly sexual mode of transmission complicating childbirth: Secondary | ICD-10-CM | POA: Diagnosis present

## 2020-09-05 LAB — CBC
HCT: 34.9 % — ABNORMAL LOW (ref 36.0–46.0)
Hemoglobin: 11.4 g/dL — ABNORMAL LOW (ref 12.0–15.0)
MCH: 30.6 pg (ref 26.0–34.0)
MCHC: 32.7 g/dL (ref 30.0–36.0)
MCV: 93.6 fL (ref 80.0–100.0)
Platelets: 205 10*3/uL (ref 150–400)
RBC: 3.73 MIL/uL — ABNORMAL LOW (ref 3.87–5.11)
RDW: 14.4 % (ref 11.5–15.5)
WBC: 7.5 10*3/uL (ref 4.0–10.5)
nRBC: 0 % (ref 0.0–0.2)

## 2020-09-05 LAB — COMPREHENSIVE METABOLIC PANEL
ALT: 44 U/L (ref 0–44)
AST: 35 U/L (ref 15–41)
Albumin: 2.9 g/dL — ABNORMAL LOW (ref 3.5–5.0)
Alkaline Phosphatase: 129 U/L — ABNORMAL HIGH (ref 38–126)
Anion gap: 7 (ref 5–15)
BUN: 10 mg/dL (ref 6–20)
CO2: 24 mmol/L (ref 22–32)
Calcium: 9.4 mg/dL (ref 8.9–10.3)
Chloride: 107 mmol/L (ref 98–111)
Creatinine, Ser: 0.67 mg/dL (ref 0.44–1.00)
GFR, Estimated: >60 mL/min (ref >60)
Glucose, Bld: 95 mg/dL (ref 70–99)
Potassium: 3.9 mmol/L (ref 3.5–5.1)
Sodium: 138 mmol/L (ref 135–145)
Total Bilirubin: 0.2 mg/dL — ABNORMAL LOW (ref 0.3–1.2)
Total Protein: 6.5 g/dL (ref 6.5–8.1)

## 2020-09-05 LAB — TYPE AND SCREEN
ABO/RH(D): O POS
Antibody Screen: NEGATIVE

## 2020-09-05 LAB — PROTEIN / CREATININE RATIO, URINE
Creatinine, Urine: 33.39 mg/dL
Total Protein, Urine: <6 mg/dL

## 2020-09-05 MED ORDER — OXYTOCIN BOLUS FROM INFUSION
333.0000 mL | Freq: Once | INTRAVENOUS | Status: AC
Start: 2020-09-05 — End: 2020-09-06
  Administered 2020-09-06: 333 mL via INTRAVENOUS

## 2020-09-05 MED ORDER — MISOPROSTOL 25 MCG QUARTER TABLET: 25.0000 ug | ORAL_TABLET | ORAL | Status: DC | PRN

## 2020-09-05 MED ORDER — LACTATED RINGERS IV SOLN: 500.0000 mL | INTRAVENOUS | Status: DC | PRN

## 2020-09-05 MED ORDER — OXYTOCIN-SODIUM CHLORIDE 30-0.9 UT/500ML-% IV SOLN
2.5000 [IU]/h | INTRAVENOUS | Status: DC
  Filled 2020-09-05: qty 500

## 2020-09-05 MED ORDER — PENICILLIN G POT IN DEXTROSE 60000 UNIT/ML IV SOLN
3.0000 10*6.[IU] | INTRAVENOUS | Status: DC
  Administered 2020-09-05 – 2020-09-06 (×4): 3 10*6.[IU] via INTRAVENOUS
  Filled 2020-09-05 (×4): qty 50

## 2020-09-05 MED ORDER — LACTATED RINGERS IV SOLN: INTRAVENOUS | Status: DC

## 2020-09-05 MED ORDER — OXYCODONE-ACETAMINOPHEN 5-325 MG PO TABS: 2.0000 | ORAL_TABLET | ORAL | Status: DC | PRN

## 2020-09-05 MED ORDER — VALACYCLOVIR HCL 500 MG PO TABS
500.0000 mg | ORAL_TABLET | Freq: Two times a day (BID) | ORAL | Status: DC
  Administered 2020-09-05: 500 mg via ORAL
  Filled 2020-09-05: qty 1

## 2020-09-05 MED ORDER — ACETAMINOPHEN 325 MG PO TABS: 650.0000 mg | ORAL_TABLET | ORAL | Status: DC | PRN

## 2020-09-05 MED ORDER — ONDANSETRON HCL 4 MG/2ML IJ SOLN
4.0000 mg | Freq: Four times a day (QID) | INTRAMUSCULAR | Status: DC | PRN
  Administered 2020-09-06: 4 mg via INTRAVENOUS
  Filled 2020-09-05: qty 2

## 2020-09-05 MED ORDER — SODIUM CHLORIDE 0.9 % IV SOLN
5.0000 10*6.[IU] | Freq: Once | INTRAVENOUS | Status: AC
  Administered 2020-09-05: 5 10*6.[IU] via INTRAVENOUS
  Filled 2020-09-05: qty 5

## 2020-09-05 MED ORDER — SOD CITRATE-CITRIC ACID 500-334 MG/5ML PO SOLN: 30.0000 mL | ORAL | Status: DC | PRN

## 2020-09-05 MED ORDER — TERBUTALINE SULFATE 1 MG/ML IJ SOLN: 0.2500 mg | Freq: Once | INTRAMUSCULAR | Status: DC | PRN

## 2020-09-05 MED ORDER — MISOPROSTOL 50MCG HALF TABLET
50.0000 ug | ORAL_TABLET | ORAL | Status: DC | PRN
  Administered 2020-09-05 (×3): 50 ug via BUCCAL
  Filled 2020-09-05 (×3): qty 1

## 2020-09-05 MED ORDER — LIDOCAINE HCL (PF) 1 % IJ SOLN: 30.0000 mL | INTRAMUSCULAR | Status: DC | PRN

## 2020-09-05 MED ORDER — FENTANYL CITRATE (PF) 100 MCG/2ML IJ SOLN: 100.0000 ug | INTRAMUSCULAR | Status: DC | PRN

## 2020-09-05 MED ORDER — OXYCODONE-ACETAMINOPHEN 5-325 MG PO TABS: 1.0000 | ORAL_TABLET | ORAL | Status: DC | PRN

## 2020-09-05 MED ORDER — LABETALOL HCL 100 MG PO TABS
100.0000 mg | ORAL_TABLET | Freq: Two times a day (BID) | ORAL | Status: DC
  Administered 2020-09-05 – 2020-09-06 (×2): 100 mg via ORAL
  Filled 2020-09-05 (×2): qty 1

## 2020-09-05 MED ORDER — OXYTOCIN-SODIUM CHLORIDE 30-0.9 UT/500ML-% IV SOLN: 1.0000 m[IU]/min | INTRAVENOUS | Status: DC

## 2020-09-05 NOTE — H&P (Signed)
OBSTETRIC ADMISSION HISTORY AND PHYSICAL  Mackenzie Allen is a 25 y.o. female G1P0 with IUP at 54w3dby LMP presenting for IOL due to [place] She reports +FMs, No LOF, no VB, no blurry vision, headaches or peripheral edema, and RUQ pain.  She plans on breast feeding. She request undecided for birth control. She received her prenatal care at  [facility] --->  Estimated Date of Delivery: 09/09/20   Nursing Staff Provider  [place]  Language   ENGLISH Anatomy [place] wnl  Flu Vaccine  02/23/20 Genetic Screen  NIPS:low risk female   AFP:   Screen neg Horizon: SMA not resulted, neg 3 out of 4   TDaP Vaccine   06-25-20 Hgb A1C or  GTT Early  Third trimester Nml 2 hr GTT  COVID Vaccine    LAB RESULTS   Rhogam  NA Blood Type O/Positive/-- (02/07 1226)   Feeding Plan  BREASTFEED Antibody Negative (02/07 1226)  Contraception  UNSURE Rubella 3.26 (02/07 1226)  Circumcision  YES RPR Non Reactive (06/10 1047)   Pediatrician   GNeesesHBsAg Negative (02/07 1226)   Support Person FOB/MOTHER HCVAb neg  Prenatal Classes  HIV Non Reactive (06/10 1047)     BTL Consent  GBS   (For PCN allergy, check sensitivities)   VBAC Consent  Pap 02/23/20    Hgb Electro    BP Cuff Ordered 02/13/2020 CF     SMA     Waterbirth  [ ]  Class [ ]  Consent [ ]  CNM visit    Induction  [ ]  Orders Entered [ ] Foley Y/N   Sono:    @[redacted]w[redacted]d , CWD, normal anatomy, cephalic presentation,  [id number] 37% EFW   Prenatal History/Complications:  Recurrent trich History of HSV on valtrex prophylaxis since 36 weeks. States many years ago she was tested via blood work and positive but no official outbreak in the past.  cHTN on labetalol: SBP 110-120s at home.   Past Medical History: Past Medical History:  Diagnosis Date   Herpes genitalia    Hypertension    Medical history non-contributory     Past Surgical History: Past Surgical History:  Procedure Laterality Date    WRIST SURGERY      Obstetrical History: OB History     Gravida  1   Para  0   Term      Preterm      AB      Living         SAB      IAB      Ectopic      Multiple      Live Births              Social History Social History   Socioeconomic History   Marital status: Single    Spouse name: Not on file   Number of children: Not on file   Years of education: Not on file   Highest education level: Not on file  Occupational History   Occupation: [employer]  Tobacco Use   Smoking status: Former    Types: Cigars   Smokeless tobacco: Never   Tobacco comments:    1 Cigar a day   Vaping Use   Vaping Use: Never used  Substance and Sexual Activity   Alcohol use: Not Currently   Drug use: Not Currently    Types: Marijuana    Comment: not since becoming  pregnant   Sexual activity: Yes  Other Topics Concern   Not on file  Social History Narrative   Not on file   Social Determinants of Health   Financial Resource Strain: Not on file  Food Insecurity: Not on file  Transportation Needs: Not on file  Physical Activity: Not on file  Stress: Not on file  Social Connections: Not on file    Family History: Family History  Problem Relation Age of Onset   Asthma Mother    Varicose Veins Mother    Asthma Brother    Asthma Maternal Grandmother    Diabetes Maternal Grandmother    Heart disease Maternal Grandmother    Hypertension Maternal Grandmother    Arthritis Paternal Grandmother     Allergies: No Known Allergies  Medications Prior to Admission  Medication Sig Dispense Refill Last Dose   aspirin 81 MG chewable tablet Chew 1 tablet (81 mg total) by mouth daily. 30 tablet 6    Blood Pressure Monitoring (BLOOD PRESSURE KIT) DEVI 1 kit by Does not apply route once a week. Check Blood Pressure regularly and record readings into the Babyscripts App.  Large Cuff.  DX O90.0 1 each 0    chlorhexidine (PERIDEX) 0.12 % solution 50 mLs by Mouth Rinse route  2 (two) times daily. (Patient not taking: Reported on 08/30/2020)  0    ferrous sulfate (FERROUSUL) 325 (65 FE) MG tablet Take 1 tablet (325 mg total) by mouth every other day. 30 tablet 3    labetalol (NORMODYNE) 100 MG tablet Take 1 tablet (100 mg total) by mouth 2 (two) times daily. 60 tablet 2    metoCLOPramide (REGLAN) 10 MG tablet Take 1 tablet (10 mg total) by mouth every 6 (six) hours. (Patient not taking: No sig reported) 60 tablet 0    metroNIDAZOLE (FLAGYL) 500 MG tablet Take two tablets by mouth twice a day, for one day.  Or you can take all four tablets at once if you can tolerate it. (Patient not taking: Reported on 08/30/2020) 4 tablet 0    Misc. Devices (GNP DIGITAL WEIGHT SCALE) MISC 1 Device by Does not apply route once a week. Check weight for Virtual visits (Patient not taking: No sig reported) 1 each 0    Prenatal Vit-Fe Fumarate-FA (MULTIVITAMIN-PRENATAL) 27-0.8 MG TABS tablet Take 1 tablet by mouth daily at 12 noon.      promethazine (PHENERGAN) 25 MG tablet Take 1 tablet (25 mg total) by mouth every 6 (six) hours as needed for nausea or vomiting. (Patient not taking: Reported on 08/30/2020) 5 tablet 0      Review of Systems   All systems reviewed and negative except as stated in HPI  Blood pressure 129/88, pulse 77, temperature 98.3 F (36.8 C), temperature source Oral, resp. rate 16, height 5' 7"  (1.702 m), weight 80.6 kg, last menstrual period 12/04/2019. General appearance: alert, cooperative, and no distress Lungs: Normal WOB  Heart: regular rate and rhythm Abdomen: soft, non-tender Pelvic: NEFG without any lesions or vesicles, pink vaginal mucosa with white spots/discharge present.  Extremities: Homans sign is negative, no sign of DVT Presentation: cephalic Fetal monitoringBaseline: 140 bpm, Variability: Good {> 6 bpm), Accelerations: Reactive, and Decelerations: Absent Uterine activityFrequency: irregular  Dilation: 1 Effacement (%): 50 Station: -3 Exam by:: k  fields, rn   Prenatal labs: ABO, Rh: --/--/O POS (08/21 1242) Antibody: NEG (08/21 1242) Rubella: 3.26 (02/07 1226) RPR: Non Reactive (06/10 1047)  HBsAg: Negative (02/07 1226)  HIV: Non Reactive (  06/10 1047)  GBS: Positive/-- (07/29 0901)    Prenatal Transfer Tool  Maternal Diabetes: No Genetic Screening: Normal Maternal Ultrasounds/Referrals: Normal Fetal Ultrasounds or other Referrals:  None Maternal Substance Abuse:  No Significant Maternal Medications:  None Significant Maternal Lab Results: Group B Strep positive  Results for orders placed or performed during the hospital encounter of 09/05/20 (from the past 24 hour(s))  CBC   Collection Time: 09/05/20 12:42 PM  Result Value Ref Range   WBC 7.5 4.0 - 10.5 K/uL   RBC 3.73 (L) 3.87 - 5.11 MIL/uL   Hemoglobin 11.4 (L) 12.0 - 15.0 g/dL   HCT 34.9 (L) 36.0 - 46.0 %   MCV 93.6 80.0 - 100.0 fL   MCH 30.6 26.0 - 34.0 pg   MCHC 32.7 30.0 - 36.0 g/dL   RDW 14.4 11.5 - 15.5 %   Platelets 205 150 - 400 K/uL   nRBC 0.0 0.0 - 0.2 %  Type and screen   Collection Time: 09/05/20 12:42 PM  Result Value Ref Range   ABO/RH(D) O POS    Antibody Screen NEG    Sample Expiration      09/08/2020,2359 Performed at [facility], [place] [address] [place], [place] [address]     Patient Active Problem List   Diagnosis Date Noted   Chronic hypertension 09/05/2020   Trichomonal vaginitis in pregnancy, third trimester 07/22/2020   History of herpes genitalis 05/17/2020   Anemia affecting pregnancy in third trimester 03/27/2020   Supervision of normal first pregnancy 02/13/2020   Chronic hypertension affecting pregnancy 01/18/2020   IUD pregnancy 01/18/2020    Assessment/Plan:  Mackenzie Allen is a 25 y.o. G1P0 at 76w3dhere for IOL due to well controlled chronic hypertension.   #Labor: Cytotec buccal given x1. Hopeful to attempt FB placement on next check.  #Pain: Planning for epidural.  #FWB: Cat 1 strip   #ID: GBS positive, PCN  #MOF: Breastfeeding  #MOC: Depo inpatient  #Circ: Yes, inpt   #Chronic hypertension: Well controlled. Asymptomatic. Will obtain baseline pre-e labs. Cont home labetalol 100 BID.   #History of HSV: Has been on valtrex BID since 36 weeks. Endorses no previous official lesions, however tested positive via blood test many years ago. External exam without concerning lesions. Cont valtrex.   #Recurrent trichomonas vaginitis: Asymptomatic. Positive x3, most recently treated on 8/4 with flagyl (patient confirmed she completed dose). Likely too early for TOC.  Will need pp TOC.    SPatriciaann Clan DO  09/05/2020, 3:47 PM

## 2020-09-05 NOTE — Progress Notes (Signed)
Labor Progress Note Mackenzie Allen is a 25 y.o. G1P0 at [redacted]w[redacted]d presented for IOL due to [place].   S: Doing well.   O:  BP 130/88   Pulse 81   Temp 98 F (36.7 C) (Oral)   Resp 16   Ht 5\' 7"  (1.702 m)   Wt 80.6 kg   LMP 12/04/2019   BMI 27.82 kg/m  EFM: 145/mod/15x15/no decels   CVE: Dilation: 1 Effacement (%): 60 Cervical Position: Middle Station: -2 Presentation: Vertex Exam by:: Asheton Viramontes   [name]: 25 y.o. G1P0 [redacted]w[redacted]d  #Labor: Some softening since last check, s/p cytotec x1. Placed FB after verbal consent and additional buccal cytotec given. Tolerated well.  #Pain: PRN  #FWB: Cat 1 #GBS positive  #cHTN: Stable and asymptomatic. Pre-e labs WNL. Cont home labetalol. Will monitor.   [redacted]w[redacted]d, DO 6:01 PM

## 2020-09-06 ENCOUNTER — Encounter (HOSPITAL_COMMUNITY): Payer: Self-pay | Admitting: Obstetrics and Gynecology

## 2020-09-06 ENCOUNTER — Inpatient Hospital Stay (HOSPITAL_COMMUNITY): Payer: Medicaid Other | Admitting: Anesthesiology

## 2020-09-06 ENCOUNTER — Encounter: Payer: Medicaid Other | Admitting: Obstetrics

## 2020-09-06 DIAGNOSIS — O1002 Pre-existing essential hypertension complicating childbirth: Secondary | ICD-10-CM

## 2020-09-06 DIAGNOSIS — O99820 Streptococcus B carrier state complicating pregnancy: Secondary | ICD-10-CM

## 2020-09-06 DIAGNOSIS — Z3A39 39 weeks gestation of pregnancy: Secondary | ICD-10-CM

## 2020-09-06 LAB — RPR: RPR Ser Ql: NONREACTIVE

## 2020-09-06 MED ORDER — DIPHENHYDRAMINE HCL 50 MG/ML IJ SOLN
12.5000 mg | INTRAMUSCULAR | Status: DC | PRN
Start: 2020-09-06 — End: 2020-09-06

## 2020-09-06 MED ORDER — EPHEDRINE 5 MG/ML INJ: 10.0000 mg | INTRAVENOUS | Status: DC | PRN

## 2020-09-06 MED ORDER — SENNOSIDES-DOCUSATE SODIUM 8.6-50 MG PO TABS
2.0000 | ORAL_TABLET | Freq: Every day | ORAL | Status: DC
  Administered 2020-09-07: 2 via ORAL
  Filled 2020-09-06: qty 2

## 2020-09-06 MED ORDER — BENZOCAINE-MENTHOL 20-0.5 % EX AERO
1.0000 "application " | INHALATION_SPRAY | CUTANEOUS | Status: DC | PRN
  Administered 2020-09-07: 1 via TOPICAL
  Filled 2020-09-06: qty 56

## 2020-09-06 MED ORDER — PHENYLEPHRINE 40 MCG/ML (10ML) SYRINGE FOR IV PUSH (FOR BLOOD PRESSURE SUPPORT)
80.0000 ug | PREFILLED_SYRINGE | INTRAVENOUS | Status: DC | PRN
  Administered 2020-09-06: 80 ug via INTRAVENOUS

## 2020-09-06 MED ORDER — FERROUS SULFATE 325 (65 FE) MG PO TABS: 325.0000 mg | ORAL_TABLET | ORAL | Status: DC

## 2020-09-06 MED ORDER — DIBUCAINE (PERIANAL) 1 % EX OINT: 1.0000 "application " | TOPICAL_OINTMENT | CUTANEOUS | Status: DC | PRN

## 2020-09-06 MED ORDER — PRENATAL MULTIVITAMIN CH
1.0000 | ORAL_TABLET | Freq: Every day | ORAL | Status: DC
  Administered 2020-09-07: 1 via ORAL
  Filled 2020-09-06: qty 1

## 2020-09-06 MED ORDER — ACETAMINOPHEN 325 MG PO TABS: 650.0000 mg | ORAL_TABLET | ORAL | Status: DC | PRN

## 2020-09-06 MED ORDER — FENTANYL-BUPIVACAINE-NACL 0.5-0.125-0.9 MG/250ML-% EP SOLN
12.0000 mL/h | EPIDURAL | Status: DC | PRN
Start: 2020-09-06 — End: 2020-09-06
  Administered 2020-09-06: 12 mL/h via EPIDURAL
  Filled 2020-09-06: qty 250

## 2020-09-06 MED ORDER — TERBUTALINE SULFATE 1 MG/ML IJ SOLN: 0.2500 mg | Freq: Once | INTRAMUSCULAR | Status: DC | PRN

## 2020-09-06 MED ORDER — OXYTOCIN-SODIUM CHLORIDE 30-0.9 UT/500ML-% IV SOLN
1.0000 m[IU]/min | INTRAVENOUS | Status: DC
  Administered 2020-09-06: 2 m[IU]/min via INTRAVENOUS

## 2020-09-06 MED ORDER — ZOLPIDEM TARTRATE 5 MG PO TABS: 5.0000 mg | ORAL_TABLET | Freq: Every evening | ORAL | Status: DC | PRN

## 2020-09-06 MED ORDER — ONDANSETRON HCL 4 MG/2ML IJ SOLN: 4.0000 mg | INTRAMUSCULAR | Status: DC | PRN

## 2020-09-06 MED ORDER — LIDOCAINE HCL (PF) 1 % IJ SOLN
INTRAMUSCULAR | Status: DC | PRN
  Administered 2020-09-06: 12 mL via EPIDURAL

## 2020-09-06 MED ORDER — IBUPROFEN 600 MG PO TABS
600.0000 mg | ORAL_TABLET | Freq: Four times a day (QID) | ORAL | Status: DC
  Administered 2020-09-06 – 2020-09-07 (×2): 600 mg via ORAL
  Filled 2020-09-06 (×4): qty 1

## 2020-09-06 MED ORDER — DIPHENHYDRAMINE HCL 25 MG PO CAPS: 25.0000 mg | ORAL_CAPSULE | Freq: Four times a day (QID) | ORAL | Status: DC | PRN

## 2020-09-06 MED ORDER — TETANUS-DIPHTH-ACELL PERTUSSIS 5-2.5-18.5 LF-MCG/0.5 IM SUSY: 0.5000 mL | PREFILLED_SYRINGE | Freq: Once | INTRAMUSCULAR | Status: DC

## 2020-09-06 MED ORDER — LACTATED RINGERS IV SOLN
500.0000 mL | Freq: Once | INTRAVENOUS | Status: AC
  Administered 2020-09-06: 500 mL via INTRAVENOUS

## 2020-09-06 MED ORDER — PHENYLEPHRINE 40 MCG/ML (10ML) SYRINGE FOR IV PUSH (FOR BLOOD PRESSURE SUPPORT)
80.0000 ug | PREFILLED_SYRINGE | INTRAVENOUS | Status: DC | PRN
  Filled 2020-09-06: qty 10

## 2020-09-06 MED ORDER — COCONUT OIL OIL: 1.0000 "application " | TOPICAL_OIL | Status: DC | PRN

## 2020-09-06 MED ORDER — ONDANSETRON HCL 4 MG PO TABS: 4.0000 mg | ORAL_TABLET | ORAL | Status: DC | PRN

## 2020-09-06 MED ORDER — WITCH HAZEL-GLYCERIN EX PADS: 1.0000 "application " | MEDICATED_PAD | CUTANEOUS | Status: DC | PRN

## 2020-09-06 MED ORDER — SIMETHICONE 80 MG PO CHEW
80.0000 mg | CHEWABLE_TABLET | ORAL | Status: DC | PRN
  Filled 2020-09-06: qty 1

## 2020-09-06 NOTE — Anesthesia Postprocedure Evaluation (Signed)
Anesthesia Post Note  Patient: Mackenzie Allen  Procedure(s) Performed: AN AD HOC LABOR EPIDURAL     Patient location during evaluation: Mother Baby Anesthesia Type: Epidural Level of consciousness: awake Pain management: satisfactory to patient Vital Signs Assessment: post-procedure vital signs reviewed and stable Respiratory status: spontaneous breathing Cardiovascular status: stable Anesthetic complications: no   No notable events documented.  Last Vitals:  Vitals:   09/06/20 1031 09/06/20 1047  BP: (!) 131/91 (!) 130/93  Pulse: 69 72  Resp:    Temp:    SpO2:      Last Pain:  Vitals:   09/06/20 0500  TempSrc: Oral  PainSc: 0-No pain   Pain Goal:                   [employer]

## 2020-09-06 NOTE — Lactation Note (Signed)
This note was copied from a baby's chart. Lactation Consultation Note  Patient Name: Mackenzie Allen MCNOB'S Date: 09/06/2020 Reason for consult: Initial assessment;Term;Primapara;1st time breastfeeding Age:24 hours   P1 mother whose infant is now 33 hours old.  This is a term baby at 39+4 weeks.  Baby swaddled and awake; offered to assist with breast feeding and mother interested.  Taught hand expression and mother unable to express colostrum at this time.  Assisted to latch in the cross cradle hold easily.  Baby had a wide gape and flanged lips.  Mother denied pain with feeding.  Observed him feeding for 22 minutes while providing breast feeding education.    Mother will feed 8-12 times/24 hours or sooner if baby shows cues.  Suggested she call for latch assistance as needed.  Reviewed feeding log and newborn care.  No support person present at this time but father will return shortly.  Mom made aware of O/P services, breastfeeding support groups, community resources, and our phone # for post-discharge questions.  Mother has a DEBP for home use.  RN updated.   Maternal Data Has patient been taught Hand Expression?: Yes Does the patient have breastfeeding experience prior to this delivery?: No  Feeding Mother's Current Feeding Choice: Breast Milk  LATCH Score Latch: Repeated attempts needed to sustain latch, nipple held in mouth throughout feeding, stimulation needed to elicit sucking reflex.  Audible Swallowing: A few with stimulation  Type of Nipple: Everted at rest and after stimulation  Comfort (Breast/Nipple): Soft / non-tender  Hold (Positioning): Assistance needed to correctly position infant at breast and maintain latch.  LATCH Score: 7   Lactation Tools Discussed/Used    Interventions Interventions: Breast feeding basics reviewed;Assisted with latch;Skin to skin;Breast massage;Hand express;Breast compression;Adjust position;Coconut oil;Position  options;Support pillows;Education  Discharge Pump: Personal WIC Program: Yes  Consult Status Consult Status: Follow-up Date: 09/07/20 Follow-up type: In-patient    Dora Sims 09/06/2020, 12:33 PM

## 2020-09-06 NOTE — Anesthesia Preprocedure Evaluation (Signed)
Anesthesia Evaluation  Patient identified by MRN, date of birth, ID band Patient awake    Reviewed: Allergy & Precautions, H&P , Patient's Chart, lab work & pertinent test results, reviewed documented beta blocker date and time   Airway Mallampati: I       Dental no notable dental hx.    Pulmonary former smoker,    Pulmonary exam normal        Cardiovascular hypertension, Pt. on home beta blockers negative cardio ROS Normal cardiovascular exam     Neuro/Psych negative neurological ROS  negative psych ROS   GI/Hepatic negative GI ROS, Neg liver ROS,   Endo/Other  negative endocrine ROS  Renal/GU negative Renal ROS  negative genitourinary   Musculoskeletal negative musculoskeletal ROS (+)   Abdominal Normal abdominal exam  (+)   Peds  Hematology  (+) Blood dyscrasia, anemia ,   Anesthesia Other Findings   Reproductive/Obstetrics (+) Pregnancy                             Anesthesia Physical Anesthesia Plan  ASA: 2  Anesthesia Plan: Epidural   Post-op Pain Management:    Induction:   PONV Risk Score and Plan:   Airway Management Planned:   Additional Equipment:   Intra-op Plan:   Post-operative Plan:   Informed Consent: I have reviewed the patients History and Physical, chart, labs and discussed the procedure including the risks, benefits and alternatives for the proposed anesthesia with the patient or authorized representative who has indicated his/her understanding and acceptance.       Plan Discussed with:   Anesthesia Plan Comments:         Anesthesia Quick Evaluation

## 2020-09-06 NOTE — Progress Notes (Signed)
Labor Progress Note Mackenzie Allen is a 25 y.o. G1P0 at [redacted]w[redacted]d presented for IOL for CHTN. S: Feeling pressure but not significant pain.  O:  BP (!) 137/100   Pulse 73   Temp 98.3 F (36.8 C) (Oral)   Resp 20   Ht 5\' 7"  (1.702 m)   Wt 80.6 kg   LMP 12/04/2019   SpO2 99%   BMI 27.82 kg/m  EFM: 130/Moderate/ + Accels, + Early Deccels  CVE: Dilation: Lip/rim Dilation Complete Date: 09/06/20 Dilation Complete Time: 09/08/20 Effacement (%): 100 Cervical Position: Middle Station: -1 Presentation: Vertex Exam by:: Dr. [email or web address]   [name]: 25 y.o. G1P0 [redacted]w[redacted]d presented for IOL for CHTN. #Labor: Progressing well. AROM performed with clear fluid.  Continue Pitocin #Pain: Epidural in place. #FWB: Cat 1 #GBS positive  [redacted]w[redacted]d, MD 7:44 AM

## 2020-09-06 NOTE — Progress Notes (Addendum)
Labor Progress Note Mackenzie Allen is a 25 y.o. G1P0 at [redacted]w[redacted]d presented for  induction of labor due to [place]. S: Epidural controlling pain well.  O:  BP 130/84   Pulse 77   Temp 98.4 F (36.9 C) (Oral)   Resp 17   Ht 5\' 7"  (1.702 m)   Wt 80.6 kg   LMP 12/04/2019   SpO2 100%   BMI 27.82 kg/m  EFM: 135/Moderate/ + Accels, + Late decels  CVE: Dilation: 7 Effacement (%): 80 Cervical Position: Middle Station: -1 Presentation: Vertex Exam by:: [email or web address] Clemons RN   [name]: 25 y.o. G1P0 [redacted]w[redacted]d presenting for induction of labor due to [redacted]w[redacted]d. #Labor: Foley ballon out.  Progressing well.  Late decels resolved with repositioning on side. #Pain: Epidural in place. #FWB: Cat 2 #GBS positive, on Penicillin  [place], MD 2:25 AM

## 2020-09-06 NOTE — Anesthesia Procedure Notes (Signed)
Epidural Patient location during procedure: OB Start time: 09/06/2020 1:13 AM End time: 09/06/2020 1:17 AM  Preanesthetic Checklist Completed: patient identified, IV checked, site marked, risks and benefits discussed, surgical consent, monitors and equipment checked, pre-op evaluation and timeout performed  Epidural Patient position: sitting Prep: DuraPrep and site prepped and draped Patient monitoring: continuous pulse ox and blood pressure Approach: midline Location: L3-L4 Injection technique: LOR air  Needle:  Needle type: Tuohy  Needle gauge: 17 G Needle length: 9 cm and 9 Needle insertion depth: 5 cm cm Catheter type: closed end flexible Catheter size: 19 Gauge Catheter at skin depth: 10 cm Test dose: negative and Other  Assessment Events: blood not aspirated, injection not painful, no injection resistance, no paresthesia and negative IV test  Additional Notes Reason for block:procedure for pain

## 2020-09-06 NOTE — Discharge Summary (Addendum)
Postpartum Discharge Summary     Patient Name: Mackenzie Allen DOB: [birth year] MRN: [id number]  Date of admission: 09/05/2020 Delivery date:09/06/2020  Delivering provider: Tyna Jaksch  Date of discharge: 09/07/2020  Admitting diagnosis: Chronic hypertension [I10] Intrauterine pregnancy: [redacted]w[redacted]d    Secondary diagnosis:  Active Problems:   Chronic hypertension  Additional problems: None   Discharge diagnosis: Term Pregnancy Delivered and CHTN                                              Post partum procedures: none Augmentation: AROM, Pitocin, Cytotec, and IP Foley Complications: None  Hospital course: Induction of Labor With Vaginal Delivery   25y.o. yo G1P0 at 334w4das admitted to the hospital 09/05/2020 for induction of labor.  Indication for induction:  CHTN .  Patient had an uncomplicated labor course as follows: Membrane Rupture Time/Date: 6:40 AM ,09/06/2020   Delivery Method:Vaginal, Spontaneous  Episiotomy: None  Lacerations:  None  Details of delivery can be found in separate delivery note.  Patient had a routine postpartum course. Patient is discharged home 09/07/20.  Newborn Data: Birth date:09/06/2020  Birth time:9:00 AM  Gender:Female  Living status:Living  Apgars:8 ,9  Weight:2880 g   Magnesium Sulfate received: No BMZ received: No Rhophylac:N/A MMR:N/A T-DaP:Given prenatally Flu: Yes Transfusion:No  Physical exam  Vitals:   09/06/20 1320 09/06/20 1725 09/06/20 2158 09/07/20 0250  BP: 118/86 119/69 (!) 135/98 110/76  Pulse: 79 66 72 73  Resp: 18 17 16 17   Temp: 98.4 F (36.9 C) 98 F (36.7 C) 98.4 F (36.9 C) 98.1 F (36.7 C)  TempSrc: Oral Oral Oral Oral  SpO2:   100% 100%  Weight:      Height:       General: alert, cooperative, and no distress Lochia: appropriate Uterine Fundus: firm DVT Evaluation: No evidence of DVT seen on physical exam. Labs: Lab Results  Component Value Date   WBC 9.9 09/07/2020   HGB  10.2 (L) 09/07/2020   HCT 30.6 (L) 09/07/2020   MCV 92.2 09/07/2020   PLT 189 09/07/2020   CMP Latest Ref Rng & Units 09/05/2020  Glucose 70 - 99 mg/dL 95  BUN 6 - 20 mg/dL 10  Creatinine 0.44 - 1.00 mg/dL 0.67  Sodium 135 - 145 mmol/L 138  Potassium 3.5 - 5.1 mmol/L 3.9  Chloride 98 - 111 mmol/L 107  CO2 22 - 32 mmol/L 24  Calcium 8.9 - 10.3 mg/dL 9.4  Total Protein 6.5 - 8.1 g/dL 6.5  Total Bilirubin 0.3 - 1.2 mg/dL 0.2(L)  Alkaline Phos 38 - 126 U/L 129(H)  AST 15 - 41 U/L 35  ALT 0 - 44 U/L 44   Edinburgh Score: Edinburgh Postnatal Depression Scale Screening Tool 09/06/2020  I have been able to laugh and see the funny side of things. 0  I have looked forward with enjoyment to things. 0  I have blamed myself unnecessarily when things went wrong. 0  I have been anxious or worried for no good reason. 0  I have felt scared or panicky for no good reason. 0  Things have been getting on top of me. 0  I have been so unhappy that I have had difficulty sleeping. 0  I have felt sad or miserable. 0  I have been so unhappy that I have been crying. 0  The thought of harming myself has occurred to me. 0  Edinburgh Postnatal Depression Scale Total 0     After visit meds:  Allergies as of 09/07/2020   No Known Allergies      Medication List     STOP taking these medications    aspirin 81 MG chewable tablet   Blood Pressure Kit Devi   GNP Digital Weight Scale Misc   labetalol 100 MG tablet Commonly known as: NORMODYNE   metroNIDAZOLE 500 MG tablet Commonly known as: FLAGYL   promethazine 25 MG tablet Commonly known as: PHENERGAN       TAKE these medications    acetaminophen 325 MG tablet Commonly known as: Tylenol Take 2 tablets (650 mg total) by mouth every 4 (four) hours as needed (for pain scale < 4).   ferrous sulfate 325 (65 FE) MG tablet Commonly known as: FerrouSul Take 1 tablet (325 mg total) by mouth every other day.   ibuprofen 200 MG  tablet Commonly known as: ADVIL Take 3 tablets (600 mg total) by mouth every 6 (six) hours.   multivitamin-prenatal 27-0.8 MG Tabs tablet Take 1 tablet by mouth daily at 12 noon.   NIFEdipine 30 MG 24 hr tablet Commonly known as: PROCARDIA-XL/NIFEDICAL-XL Take 1 tablet (30 mg total) by mouth daily.   valACYclovir 500 MG tablet Commonly known as: VALTREX Take 500 mg by mouth daily.         Discharge home in stable condition Infant Feeding: Breast Infant Disposition:home with mother Discharge instruction: per After Visit Summary and Postpartum booklet. Activity: Advance as tolerated. Pelvic rest for 6 weeks.  Diet: routine diet Future Appointments: Future Appointments  Date Time Provider [place]  09/13/2020 11:00 AM [place] None  10/05/2020  3:10 PM Leftwich-Kirby, Kathie Dike, CNM [facility] None   Follow up Visit:  Please schedule this patient for Postpartum visit in: 6 weeks with the following provider: Any provider In-Person For C/S patients schedule nurse incision check in weeks 2 weeks: no High risk pregnancy complicated by: HTN Delivery mode:  SVD Anticipated Birth Control:  other/unsure PP Procedures needed: BP check  Edinburgh:  not done at this time  Schedule Integrated [place] visit: no  No relevant baby issues  Pearla Dubonnet, MD  I personally saw and evaluated the patient, performing the key elements of the service. I developed and verified the management plan that is described in the resident's/student's note, and I agree with the content with my edits above. VSS, HRR&R, Resp unlabored, Legs neg.  Nigel Berthold, CNM 09/09/2020 10:17 AM

## 2020-09-06 NOTE — Progress Notes (Signed)
Mackenzie Allen is a 25 y.o. G1P0 at [redacted]w[redacted]d admitted for induction of labor due to [place].  Subjective: Reports feeling a lot of relief from epidural  Objective: BP 112/72   Pulse 67   Temp 98.4 F (36.9 C) (Oral)   Resp 17   Ht 5\' 7"  (1.702 m)   Wt 80.6 kg   LMP 12/04/2019   SpO2 99%   BMI 27.82 kg/m  No intake/output data recorded. No intake/output data recorded.  FHT:  FHR: 130-135 bpm, variability: moderate,  accelerations:  Present,  decelerations:  Absent UC:   regular, every 2-5 minutes SVE:   Dilation: 7 Effacement (%): 90 Station: -2 Exam by:: Dr. [email or web address]  Labs: Lab Results  Component Value Date   WBC 7.5 09/05/2020   HGB 11.4 (L) 09/05/2020   HCT 34.9 (L) 09/05/2020   MCV 93.6 09/05/2020   PLT 205 09/05/2020    Assessment / Plan: Induction of labor due to cHTN  Labor: Progressing normally, bulging bad but head ballotable. Will consider AROM at next check if head well applied. Will consider starting pitocin if tracing appropriate once 4 hrs past last cytotec.  Had deceleration ~2-4 min however recovered with position change and tracing is now reassuring with moderate variability and accels after. Preeclampsia:  no signs or symptoms of toxicity Fetal Wellbeing:  Category I Pain Control:  Epidural I/D:   GBS positive, PCN Anticipated MOD:  NSVD  09/07/2020 09/06/2020, 3:44 AM

## 2020-09-06 NOTE — Progress Notes (Signed)
Mackenzie Allen is a 25 y.o. G1P0 at [redacted]w[redacted]d admitted for induction of labor due to [place].  Subjective: Reports she feels some of the contractions. Thinking she might want an epidural  Objective: BP 130/73   Pulse 80   Temp 98.4 F (36.9 C) (Oral)   Resp 17   Ht 5\' 7"  (1.702 m)   Wt 80.6 kg   LMP 12/04/2019   BMI 27.82 kg/m  No intake/output data recorded. No intake/output data recorded.  FHT:  FHR: 130s-140 bpm, variability: moderate,  accelerations:  Present,  decelerations:  Absent UC:   irregular, every 2-6 minutes SVE:   Dilation: 1 Effacement (%): 60 Station: -2 (Exam from prior to FB placement)  Labs: Lab Results  Component Value Date   WBC 7.5 09/05/2020   HGB 11.4 (L) 09/05/2020   HCT 34.9 (L) 09/05/2020   MCV 93.6 09/05/2020   PLT 205 09/05/2020    Assessment / Plan: Induction of labor due to cHTN,  has foley balloon in place  Labor:  Cytotec#3, pitocin and consider AROM once FB out Preeclampsia:   cHTN, labs stable Fetal Wellbeing:  Category I Pain Control:   Considering epidural I/D:   GBS +, PCN Anticipated MOD:  NSVD  09/07/2020, MD, MPH OB Fellow, [facility]

## 2020-09-06 NOTE — Lactation Note (Signed)
This note was copied from a baby's chart. Lactation Consultation Note  Patient Name: Mackenzie Allen [id number] Date: 09/06/2020 Reason for consult: L&D Initial assessment;Primapara;Term Age:24 hours  Mom & baby were seen in L&D. Infant latched with relative ease for a newborn and suckled for a few minutes. Dimpling noted while infant latched despite what appeared to be a good latch and auscultation of a good swallow.  Lactation to f/u later today. Lurline Hare [facility] 09/06/2020, 9:50 AM

## 2020-09-07 ENCOUNTER — Other Ambulatory Visit (HOSPITAL_COMMUNITY): Payer: Self-pay

## 2020-09-07 LAB — CBC
HCT: 30.6 % — ABNORMAL LOW (ref 36.0–46.0)
Hemoglobin: 10.2 g/dL — ABNORMAL LOW (ref 12.0–15.0)
MCH: 30.7 pg (ref 26.0–34.0)
MCHC: 33.3 g/dL (ref 30.0–36.0)
MCV: 92.2 fL (ref 80.0–100.0)
Platelets: 189 10*3/uL (ref 150–400)
RBC: 3.32 MIL/uL — ABNORMAL LOW (ref 3.87–5.11)
RDW: 14.3 % (ref 11.5–15.5)
WBC: 9.9 10*3/uL (ref 4.0–10.5)
nRBC: 0 % (ref 0.0–0.2)

## 2020-09-07 MED ORDER — IBUPROFEN 200 MG PO TABS: 600.0000 mg | ORAL_TABLET | Freq: Four times a day (QID) | ORAL | 0 refills | Status: AC

## 2020-09-07 MED ORDER — NIFEDIPINE ER 30 MG PO TB24
30.0000 mg | ORAL_TABLET | Freq: Every day | ORAL | 1 refills | Status: DC
  Filled 2020-09-07: qty 30, 30d supply, fill #0

## 2020-09-07 MED ORDER — ACETAMINOPHEN 325 MG PO TABS: 650.0000 mg | ORAL_TABLET | ORAL | Status: DC | PRN

## 2020-09-07 NOTE — Progress Notes (Signed)
Circumcision Consent  Discussed with mom at bedside about circumcision.   Circumcision is a surgery that removes the skin that covers the tip of the penis, called the "foreskin." Circumcision is usually done when a boy is between 1 and 10 days old, sometimes up to 3-4 weeks old.  The most common reasons boys are circumcised include for cultural/religious beliefs or for parental preference (potentially easier to clean, so baby looks like daddy, etc).  There may be some medical benefits for circumcision:   Circumcised boys seem to have slightly lower rates of: ? Urinary tract infections (per the American Academy of Pediatrics an uncircumcised boy has a 1/100 chance of developing a UTI in the first year of life, a circumcised boy at a 01/998 chance of developing a UTI in the first year of life- a 10% reduction) ? Penis cancer (typically rare- an uncircumcised female has a 1 in 100,000 chance of developing cancer of the penis) ? Sexually transmitted infection (in endemic areas, including HIV, HPV and Herpes- circumcision does NOT protect against gonorrhea, chlamydia, trachomatis, or syphilis) ? Phimosis: a condition where that makes retraction of the foreskin over the glans impossible (0.4 per 1000 boys per year or 0.6% of boys are affected by their 15th birthday)  Boys and men who are not circumcised can reduce these extra risks by: ? Cleaning their penis well ? Using condoms during sex  What are the risks of circumcision?  As with any surgical procedure, there are risks and complications. In circumcision, complications are rare and usually minor, the most common being: ? Bleeding- risk is reduced by holding each clamp for 30 seconds prior to a cut being made, and by holding pressure after the procedure is done ? Infection- the penis is cleaned prior to the procedure, and the procedure is done under sterile technique ? Damage to the urethra or amputation of the penis  How is circumcision done  in baby boys?  The baby will be placed on a special table and the legs restrained for their safety. Numbing medication is injected into the penis, and the skin is cleansed with betadine to decrease the risk of infection.   What to expect:  The penis will look red and raw for 5-7 days as it heals. We expect scabbing around where the cut was made, as well as clear-pink fluid and some swelling of the penis right after the procedure. If your baby's circumcision starts to bleed or develops pus, please contact your pediatrician immediately.  All questions were answered and mother consented.  Valisha Heslin Cresenzo-Dishmon Obstetrics Fellow  

## 2020-09-07 NOTE — Lactation Note (Signed)
This note was copied from a baby's chart. Lactation Consultation Note  Patient Name: Mackenzie Allen [id number] Date: 09/07/2020 Reason for consult: Follow-up assessment;Term;Primapara;1st time breastfeeding Age:24 hours   P1 mother whose infant is now 45 hours old.  This is a term baby at 39+4 weeks.  Mother has been breast feeding without difficulty.  She had no questions/concerns related to breast feeding and denies pain with latching.  Last LATCH score was an 8; voiding/stooling well.  Manual pump provided with instructions for use.  #24 flange is appropriate at this time.  Encouraged mother to continue her current feeding plan after discharge.  Mother has a DEBP for home use.  She has our OP phone number for any further concerns.  Father will arrive shortly; mother excited for discharge today.   Maternal Data Has patient been taught Hand Expression?: Yes Does the patient have breastfeeding experience prior to this delivery?: No  Feeding Mother's Current Feeding Choice: Breast Milk  LATCH Score                    Lactation Tools Discussed/Used    Interventions Interventions: Education  Discharge Discharge Education: Engorgement and breast care Pump: Personal;Manual  Consult Status Consult Status: Complete Date: 09/07/20 Follow-up type: Call as needed    Leighanna Kirn R Christalynn [place] 09/07/2020, 10:17 AM

## 2020-09-07 NOTE — Progress Notes (Signed)
Patient enrolled in postpartum baby scripts. First blood pressure recorded and seen in data entry.

## 2020-09-07 NOTE — Progress Notes (Addendum)
Post Partum Day 1 Subjective: no complaints, up ad lib, voiding, tolerating PO, and + flatus  Objective: Blood pressure 110/76, pulse 73, temperature 98.1 F (36.7 C), temperature source Oral, resp. rate 17, height _0  (1.702 m), weight 80.6 kg, last menstrual period 12/04/2019, SpO2 100 %, unknown if currently breastfeeding.  Physical Exam:  General: alert, cooperative, appears stated age, and no distress Lochia: appropriate Uterine Fundus: firm DVT Evaluation: No evidence of DVT seen on physical exam.  Recent Labs    09/05/20 1242 09/07/20 0515  HGB 11.4* 10.2*  HCT 34.9* 30.6*    Assessment/Plan: Discharge home, Breastfeeding, and Circumcision prior to discharge\ Patient may discharge home after circumcision and when baby boy is cleared by pediatrics   LOS: 2 days   Pearla Dubonnet 09/07/2020, 7:55 AM   I personally saw and evaluated the patient, performing the key elements of the service. I developed and verified the management plan that is described in the resident's/student's note, and I agree with the content with my edits above. VSS, HRR&R, Resp unlabored, Legs neg.  Nigel Berthold, CNM 09/07/2020 8:15 AM

## 2020-09-08 ENCOUNTER — Telehealth: Payer: Self-pay | Admitting: Obstetrics & Gynecology

## 2020-09-08 NOTE — Telephone Encounter (Signed)
Called patient to follow up from yesterday's circumcision.  Wanted to let the patient know that we were thinking of them and hopeful that urology would be able to provide more insight. Pt reported that baby was doing well- eating,peeing and pooping without problems.  Plan would be for follow up at the 4 mos visit.  Expressed my apologies for yesterday's incident and encouraged pt to reach out with any follow up.  Was glad to hear that baby was doing ok.  Myna Hidalgo, DO Attending Obstetrician & Gynecologist, [facility] for [employer], [facility]

## 2020-09-13 ENCOUNTER — Ambulatory Visit (INDEPENDENT_AMBULATORY_CARE_PROVIDER_SITE_OTHER): Payer: Medicaid Other

## 2020-09-13 ENCOUNTER — Other Ambulatory Visit: Payer: Self-pay

## 2020-09-13 VITALS — BP 125/87 | HR 100

## 2020-09-13 DIAGNOSIS — Z0130 Encounter for examination of blood pressure without abnormal findings: Secondary | ICD-10-CM

## 2020-09-13 NOTE — Progress Notes (Signed)
Subjective:  Mackenzie Allen is a 25 y.o. female here for BP check.   Hypertension ROS: taking medications as instructed, no medication side effects noted, no TIA's, no chest pain on exertion, no dyspnea on exertion, and no swelling of ankles.    Objective:  LMP 12/04/2019   Appearance alert, well appearing, and in no distress. General exam BP noted to be well controlled today in office.    Assessment:   Blood Pressure well controlled.   Plan:  Current treatment plan is effective, no change in therapy.

## 2020-09-16 ENCOUNTER — Other Ambulatory Visit: Payer: Self-pay | Admitting: *Deleted

## 2020-09-16 ENCOUNTER — Telehealth (HOSPITAL_COMMUNITY): Payer: Self-pay | Admitting: *Deleted

## 2020-09-16 DIAGNOSIS — Z8619 Personal history of other infectious and parasitic diseases: Secondary | ICD-10-CM

## 2020-09-16 MED ORDER — VALACYCLOVIR HCL 500 MG PO TABS: 1000.0000 mg | ORAL_TABLET | Freq: Two times a day (BID) | ORAL | 1 refills | Status: DC

## 2020-09-16 MED ORDER — VALACYCLOVIR HCL 1 G PO TABS: 1000.0000 mg | ORAL_TABLET | Freq: Two times a day (BID) | ORAL | 1 refills | Status: DC

## 2020-09-16 NOTE — Telephone Encounter (Signed)
Patient voiced no questions or concerns regarding her own health. EPDS = 5. Patient voiced no questions or concerns regarding baby at this time. Patient reported infant sleeps in a bassinet or crib on his back. RN reviewed ABCs of safe sleep - patient verbalized understanding. Patient requested RN email information on hospital's virtual breastfeeding and pumping support groups. Email sent. Deforest Hoyles, RN 09/16/20, [phone].

## 2020-10-05 ENCOUNTER — Encounter: Payer: Self-pay | Admitting: Advanced Practice Midwife

## 2020-10-05 ENCOUNTER — Other Ambulatory Visit: Payer: Self-pay

## 2020-10-05 ENCOUNTER — Ambulatory Visit (INDEPENDENT_AMBULATORY_CARE_PROVIDER_SITE_OTHER): Payer: Medicaid Other | Admitting: Advanced Practice Midwife

## 2020-10-05 VITALS — BP 126/88 | HR 92 | Wt 164.0 lb

## 2020-10-05 DIAGNOSIS — Z3042 Encounter for surveillance of injectable contraceptive: Secondary | ICD-10-CM

## 2020-10-05 DIAGNOSIS — Z3009 Encounter for other general counseling and advice on contraception: Secondary | ICD-10-CM

## 2020-10-05 MED ORDER — MEDROXYPROGESTERONE ACETATE 150 MG/ML IM SUSP: 150.0000 mg | INTRAMUSCULAR | 4 refills | Status: DC

## 2020-10-05 MED ORDER — MEDROXYPROGESTERONE ACETATE 150 MG/ML IM SUSP
150.0000 mg | INTRAMUSCULAR | Status: DC
  Administered 2020-10-05: 150 mg via INTRAMUSCULAR

## 2020-10-05 NOTE — Progress Notes (Signed)
..   Post Partum Visit Note  Mackenzie Allen is a 25 y.o. G40P1001 female who presents for a postpartum visit. She is 4 weeks postpartum following a normal spontaneous vaginal delivery.  I have fully reviewed the prenatal and intrapartum course. The delivery was at 39.5 gestational weeks.  Anesthesia: epidural. Postpartum course has been good. Baby is doing well. Baby is feeding by bottle - Enfamil AR. Bleeding staining only. Bowel function is normal. Bladder function is normal. Patient is not sexually active. Contraception method is none. Postpartum depression screening: negative.   The pregnancy intention screening data noted above was reviewed. Potential methods of contraception were discussed. The patient elected to proceed with No data recorded.   Edinburgh Postnatal Depression Scale - 10/05/20 1521       Edinburgh Postnatal Depression Scale:  In the Past 7 Days   I have been able to laugh and see the funny side of things. 0    I have looked forward with enjoyment to things. 0    I have blamed myself unnecessarily when things went wrong. 0    I have been anxious or worried for no good reason. 0    I have felt scared or panicky for no good reason. 0    Things have been getting on top of me. 0    I have been so unhappy that I have had difficulty sleeping. 0    I have felt sad or miserable. 0    I have been so unhappy that I have been crying. 0    The thought of harming myself has occurred to me. 0    Edinburgh Postnatal Depression Scale Total 0             Health Maintenance Due  Topic Date Due   COVID-19 Vaccine (1) Never done   HPV VACCINES (1 - 2-dose series) Never done   INFLUENZA VACCINE  08/16/2020    The following portions of the patient's history were reviewed and updated as appropriate: allergies, current medications, past family history, past medical history, past social history, past surgical history, and problem list.  Review of Systems Pertinent  items noted in HPI and remainder of comprehensive ROS otherwise negative.  Objective:  BP 126/88   Pulse 92   Wt 164 lb (74.4 kg)   LMP 12/04/2019   Breastfeeding No   BMI 25.69 kg/m    VS reviewed, nursing note reviewed,  Constitutional: well developed, well nourished, no distress HEENT: normocephalic CV: normal rate Pulm/chest wall: normal effort Abdomen: soft Neuro: alert and oriented x 3 Skin: warm, dry Psych: affect normal    Assessment:  1. Encounter for general counseling and advice on contraceptive management --Had Mirena in the past and liked the method but Mirena came out and was never found when she found out about this pregnancy.  Discussed expulsion rates, effectiveness of methods. Pt to consider Mirena again in the future with some extra string checks for reassurance.  Will return to Depo today with Rx for next doses.  - medroxyPROGESTERone (DEPO-PROVERA) injection 150 mg - medroxyPROGESTERone (DEPO-PROVERA) 150 MG/ML injection; Inject 1 mL (150 mg total) into the muscle every 3 (three) months.  Dispense: 1 mL; Refill: 4  2. Postpartum care following vaginal delivery --Doing well, no pain, has not had intercourse since 6 months pregnant, bonding well with baby. Infant had complications with circumcision and was transferred to [facility] from the [facility]. He is doing well and has follow up with [facility].  --  She has good support at home    Plan:   Essential components of care per ACOG recommendations:  1.  Mood and well being: Patient with negative depression screening today. Reviewed local resources for support.  - Patient tobacco use? No.   - hx of drug use? No.    2. Infant care and feeding:  -Patient currently breastmilk feeding? No.  -Social determinants of health (SDOH) reviewed in EPIC. No concerns  3. Sexuality, contraception and birth spacing - Patient does not want a pregnancy in the next year.   - Reviewed forms of contraception in tiered fashion.  Patient desired Depo-Provera today.   - Discussed birth spacing of 18 months  4. Sleep and fatigue -Encouraged family/partner/community support of 4 hrs of uninterrupted sleep to help with mood and fatigue  5. Physical Recovery  - Discussed patients delivery and complications. She describes her labor as good. - Patient had a Vaginal, no problems at delivery. Patient had small hemostatic periurethral lacerations only that did not require repair. Perineal healing reviewed. Patient expressed understanding - Patient has urinary incontinence? No. - Patient is safe to resume physical and sexual activity  6.  Health Maintenance - HM due items addressed Yes - Last pap smear  Diagnosis  Date Value Ref Range Status  02/23/2020   Final   - Negative for intraepithelial lesion or malignancy (NILM)   Pap smear not done at today's visit.  -Breast Cancer screening indicated? No.   7. Chronic Disease/Pregnancy Condition follow up: None  - PCP follow up  Sharen Counter, CNM Center for [employer], [facility]

## 2020-10-05 NOTE — Progress Notes (Signed)
Administered depo in L Del and pt tolerated well .Marland Kitchen Administrations This Visit     medroxyPROGESTERone (DEPO-PROVERA) injection 150 mg     Admin Date 10/05/2020 Action Given Dose 150 mg Route Intramuscular Administered By Katrina Stack, RN

## 2020-12-27 ENCOUNTER — Ambulatory Visit: Payer: Medicaid Other

## 2021-05-24 ENCOUNTER — Inpatient Hospital Stay (HOSPITAL_COMMUNITY)
Admission: AD | Admit: 2021-05-24 | Discharge: 2021-05-24 | Disposition: A | Discharge status: Home / Self Care | Payer: Medicaid Other | Attending: Obstetrics and Gynecology | Admitting: Obstetrics and Gynecology

## 2021-05-24 ENCOUNTER — Inpatient Hospital Stay (HOSPITAL_COMMUNITY): Payer: Medicaid Other

## 2021-05-24 ENCOUNTER — Encounter (HOSPITAL_COMMUNITY): Payer: Self-pay | Admitting: Obstetrics and Gynecology

## 2021-05-24 ENCOUNTER — Other Ambulatory Visit: Payer: Self-pay

## 2021-05-24 DIAGNOSIS — O211 Hyperemesis gravidarum with metabolic disturbance: Secondary | ICD-10-CM | POA: Insufficient documentation

## 2021-05-24 DIAGNOSIS — Z3A01 Less than 8 weeks gestation of pregnancy: Secondary | ICD-10-CM | POA: Insufficient documentation

## 2021-05-24 DIAGNOSIS — O208 Other hemorrhage in early pregnancy: Secondary | ICD-10-CM | POA: Insufficient documentation

## 2021-05-24 DIAGNOSIS — Z6790 Unspecified blood type, Rh positive: Secondary | ICD-10-CM | POA: Insufficient documentation

## 2021-05-24 DIAGNOSIS — E860 Dehydration: Secondary | ICD-10-CM | POA: Diagnosis not present

## 2021-05-24 DIAGNOSIS — O210 Mild hyperemesis gravidarum: Secondary | ICD-10-CM

## 2021-05-24 DIAGNOSIS — Z3491 Encounter for supervision of normal pregnancy, unspecified, first trimester: Secondary | ICD-10-CM

## 2021-05-24 DIAGNOSIS — O418X10 Other specified disorders of amniotic fluid and membranes, first trimester, not applicable or unspecified: Secondary | ICD-10-CM

## 2021-05-24 DIAGNOSIS — O219 Vomiting of pregnancy, unspecified: Secondary | ICD-10-CM | POA: Diagnosis present

## 2021-05-24 LAB — CBC
HCT: 33.1 % — ABNORMAL LOW (ref 36.0–46.0)
Hemoglobin: 11.6 g/dL — ABNORMAL LOW (ref 12.0–15.0)
MCH: 31 pg (ref 26.0–34.0)
MCHC: 35 g/dL (ref 30.0–36.0)
MCV: 88.5 fL (ref 80.0–100.0)
Platelets: 216 10*3/uL (ref 150–400)
RBC: 3.74 MIL/uL — ABNORMAL LOW (ref 3.87–5.11)
RDW: 13 % (ref 11.5–15.5)
WBC: 5.9 10*3/uL (ref 4.0–10.5)
nRBC: 0 % (ref 0.0–0.2)

## 2021-05-24 LAB — URINALYSIS, ROUTINE W REFLEX MICROSCOPIC
Bilirubin Urine: NEGATIVE
Glucose, UA: NEGATIVE mg/dL
Ketones, ur: 20 mg/dL — AB
Nitrite: NEGATIVE
Protein, ur: 30 mg/dL — AB
Specific Gravity, Urine: 1.023 (ref 1.005–1.030)
pH: 6 (ref 5.0–8.0)

## 2021-05-24 LAB — COMPREHENSIVE METABOLIC PANEL
ALT: 18 U/L (ref 0–44)
AST: 16 U/L (ref 15–41)
Albumin: 3.6 g/dL (ref 3.5–5.0)
Alkaline Phosphatase: 50 U/L (ref 38–126)
Anion gap: 4 — ABNORMAL LOW (ref 5–15)
BUN: 10 mg/dL (ref 6–20)
CO2: 25 mmol/L (ref 22–32)
Calcium: 9.3 mg/dL (ref 8.9–10.3)
Chloride: 108 mmol/L (ref 98–111)
Creatinine, Ser: 0.78 mg/dL (ref 0.44–1.00)
GFR, Estimated: >60 mL/min (ref >60)
Glucose, Bld: 94 mg/dL (ref 70–99)
Potassium: 3.4 mmol/L — ABNORMAL LOW (ref 3.5–5.1)
Sodium: 137 mmol/L (ref 135–145)
Total Bilirubin: 0.5 mg/dL (ref 0.3–1.2)
Total Protein: 6.4 g/dL — ABNORMAL LOW (ref 6.5–8.1)

## 2021-05-24 LAB — HCG, QUANTITATIVE, PREGNANCY: hCG, Beta Chain, Quant, S: 129938 m[IU]/mL — ABNORMAL HIGH (ref <5)

## 2021-05-24 LAB — POCT PREGNANCY, URINE: Preg Test, Ur: POSITIVE — AB

## 2021-05-24 MED ORDER — METOCLOPRAMIDE HCL 10 MG PO TABS
10.0000 mg | ORAL_TABLET | Freq: Once | ORAL | Status: AC
  Administered 2021-05-24: 10 mg via ORAL
  Filled 2021-05-24: qty 1

## 2021-05-24 MED ORDER — METOCLOPRAMIDE HCL 10 MG PO TABS: 10.0000 mg | ORAL_TABLET | Freq: Four times a day (QID) | ORAL | 1 refills | Status: DC | PRN

## 2021-05-24 MED ORDER — SCOPOLAMINE 1 MG/3DAYS TD PT72
1.0000 | MEDICATED_PATCH | TRANSDERMAL | Status: DC
  Administered 2021-05-24: 1.5 mg via TRANSDERMAL
  Filled 2021-05-24: qty 1

## 2021-05-24 MED ORDER — FAMOTIDINE 20 MG PO TABS: 20.0000 mg | ORAL_TABLET | Freq: Every day | ORAL | 0 refills | Status: DC

## 2021-05-24 MED ORDER — SCOPOLAMINE 1 MG/3DAYS TD PT72
1.0000 | MEDICATED_PATCH | TRANSDERMAL | 1 refills | Status: DC
Start: 2021-05-27 — End: 2021-06-15

## 2021-05-24 NOTE — Discharge Instructions (Signed)

## 2021-05-24 NOTE — MAU Note (Signed)
Mackenzie Allen is a 26 y.o. at Unknown here in MAU reporting: needs confirmation of pregnancy secondary had 1 negative and 2 positive HPT.  Also states hasn't been able to keep anything down including water x4 days ?LMP: 04/07/2021 ?Onset of complaint: 4 days ?Pain score: 0 ?Vitals:  ? 05/24/21 1025  ?BP: 121/75  ?Pulse: 73  ?Resp: 18  ?Temp: 98.6 ?F (37 ?C)  ?SpO2: 99%  ?   ?FHT:N/A ?Lab orders placed from triage:   UPT ?

## 2021-05-24 NOTE — MAU Provider Note (Addendum)
?History  ?  ? ?CSN: [id number] ? ?Arrival date and time: 05/24/21 1006 ? ? Event Date/Time  ? First Provider Initiated Contact with Patient 05/24/21 1051   ?  ? ?Chief Complaint  ?Patient presents with  ? Emesis  ? Nausea  ? ?26 year old G2 P1-0-0-1 at 6.5 weeks presenting with nausea vomiting and vaginal bleeding.  Reports onset of nausea about 2 weeks ago.  The vomiting started about 4 days ago.  She cannot tolerate food or fluids.  Physical activity worsens the nausea and vomiting.  She has not tried any over-the-counter medications.  Vaginal bleeding has been ongoing for about 10 days.  Some days are light and scant pink bleeding other days are heavier and require a pad.  She is not bleeding today.  She denies concern for STDs. ? ? ?OB History   ? ? Gravida  ?2  ? Para  ?1  ? Term  ?1  ? Preterm  ?   ? AB  ?   ? Living  ?1  ?  ? ? SAB  ?   ? IAB  ?   ? Ectopic  ?   ? Multiple  ?0  ? Live Births  ?1  ?   ?  ?  ? ? ?Past Medical History:  ?Diagnosis Date  ? Herpes genitalia   ? Hypertension   ? Medical history non-contributory   ? ? ?Past Surgical History:  ?Procedure Laterality Date  ? WRIST SURGERY    ? ? ?Family History  ?Problem Relation Age of Onset  ? Asthma Mother   ? Varicose Veins Mother   ? Asthma Brother   ? Asthma Maternal Grandmother   ? Diabetes Maternal Grandmother   ? Heart disease Maternal Grandmother   ? Hypertension Maternal Grandmother   ? Arthritis Paternal Grandmother   ? ? ?Social History  ? ?Tobacco Use  ? Smoking status: Former  ?  Types: Cigars  ? Smokeless tobacco: Never  ? Tobacco comments:  ?  1 Cigar a day   ?Vaping Use  ? Vaping Use: Never used  ?Substance Use Topics  ? Alcohol use: Not Currently  ? Drug use: Not Currently  ?  Types: Marijuana  ?  Comment: not since becoming pregnant  ? ? ?Allergies: No Known Allergies ? ?Facility-Administered Medications Prior to Admission  ?Medication Dose Route Frequency Provider Last Rate Last Admin  ? medroxyPROGESTERone (DEPO-PROVERA) injection  150 mg  150 mg Intramuscular Q90 days Leftwich-Kirby, Wilmer Floor, CNM   150 mg at 10/05/20 1633  ? ?Medications Prior to Admission  ?Medication Sig Dispense Refill Last Dose  ? Prenatal Vit-Fe Fumarate-FA (MULTIVITAMIN-PRENATAL) 27-0.8 MG TABS tablet Take 1 tablet by mouth daily at 12 noon.   05/23/2021  ? acetaminophen (TYLENOL) 325 MG tablet Take 2 tablets (650 mg total) by mouth every 4 (four) hours as needed (for pain scale < 4). (Patient not taking: Reported on 10/05/2020)     ? ferrous sulfate (FERROUSUL) 325 (65 FE) MG tablet Take 1 tablet (325 mg total) by mouth every other day. (Patient not taking: Reported on 10/05/2020) 30 tablet 3   ? medroxyPROGESTERone (DEPO-PROVERA) 150 MG/ML injection Inject 1 mL (150 mg total) into the muscle every 3 (three) months. 1 mL 4   ? NIFEdipine (ADALAT CC) 30 MG 24 hr tablet Take 1 tablet (30 mg total) by mouth daily. 30 tablet 1   ? valACYclovir (VALTREX) 1000 MG tablet Take 1 tablet (1,000 mg total) by mouth 2 (  two) times daily. (Patient not taking: Reported on 10/05/2020) 20 tablet 1   ? ? ?Review of Systems  ?Constitutional:  Negative for chills and fever.  ?Gastrointestinal:  Positive for nausea and vomiting. Negative for abdominal pain.  ?Genitourinary:  Positive for vaginal bleeding. Negative for dysuria, frequency, hematuria, urgency and vaginal discharge.  ?Physical Exam  ? ?Blood pressure 126/81, pulse 69, temperature 98.6 ?F (37 ?C), temperature source Oral, resp. rate 18, height 5\' 7"  (1.702 m), weight 68.8 kg, last menstrual period 04/07/2021, SpO2 99 %, not currently breastfeeding. ? ?Physical Exam ?Constitutional:   ?   General: She is not in acute distress. ?   Appearance: Normal appearance.  ?HENT:  ?   Head: Normocephalic and atraumatic.  ?Cardiovascular:  ?   Rate and Rhythm: Normal rate.  ?Pulmonary:  ?   Effort: Pulmonary effort is normal. No respiratory distress.  ?Abdominal:  ?   General: There is no distension.  ?   Palpations: Abdomen is soft. There is no  mass.  ?   Tenderness: There is no abdominal tenderness. There is no guarding or rebound.  ?   Hernia: No hernia is present.  ?Musculoskeletal:     ?   General: Normal range of motion.  ?   Cervical back: Normal range of motion.  ?Skin: ?   General: Skin is warm and dry.  ?Neurological:  ?   General: No focal deficit present.  ?   Mental Status: She is alert and oriented to person, place, and time.  ?Psychiatric:     ?   Mood and Affect: Mood normal.     ?   Behavior: Behavior normal.  ? ?Results for orders placed or performed during the hospital encounter of 05/24/21 (from the past 24 hour(s))  ?Pregnancy, urine POC     Status: Abnormal  ? Collection Time: 05/24/21 10:33 AM  ?Result Value Ref Range  ? Preg Test, Ur POSITIVE (A) NEGATIVE  ?Urinalysis, Routine w reflex microscopic Urine, Clean Catch     Status: Abnormal  ? Collection Time: 05/24/21 10:36 AM  ?Result Value Ref Range  ? Color, Urine YELLOW YELLOW  ? APPearance CLOUDY (A) CLEAR  ? Specific Gravity, Urine 1.023 1.005 - 1.030  ? pH 6.0 5.0 - 8.0  ? Glucose, UA NEGATIVE NEGATIVE mg/dL  ? Hgb urine dipstick MODERATE (A) NEGATIVE  ? Bilirubin Urine NEGATIVE NEGATIVE  ? Ketones, ur 20 (A) NEGATIVE mg/dL  ? Protein, ur 30 (A) NEGATIVE mg/dL  ? Nitrite NEGATIVE NEGATIVE  ? Leukocytes,Ua MODERATE (A) NEGATIVE  ? RBC / HPF 11-20 0 - 5 RBC/hpf  ? WBC, UA 21-50 0 - 5 WBC/hpf  ? Bacteria, UA MANY (A) NONE SEEN  ? Squamous Epithelial / LPF 21-50 0 - 5  ? Mucus PRESENT   ?CBC     Status: Abnormal  ? Collection Time: 05/24/21 11:16 AM  ?Result Value Ref Range  ? WBC 5.9 4.0 - 10.5 K/uL  ? RBC 3.74 (L) 3.87 - 5.11 MIL/uL  ? Hemoglobin 11.6 (L) 12.0 - 15.0 g/dL  ? HCT 33.1 (L) 36.0 - 46.0 %  ? MCV 88.5 80.0 - 100.0 fL  ? MCH 31.0 26.0 - 34.0 pg  ? MCHC 35.0 30.0 - 36.0 g/dL  ? RDW 13.0 11.5 - 15.5 %  ? Platelets 216 150 - 400 K/uL  ? nRBC 0.0 0.0 - 0.2 %  ?Comprehensive metabolic panel     Status: Abnormal  ? Collection Time: 05/24/21 11:16 AM  ?  Result Value Ref Range  ?  Sodium 137 135 - 145 mmol/L  ? Potassium 3.4 (L) 3.5 - 5.1 mmol/L  ? Chloride 108 98 - 111 mmol/L  ? CO2 25 22 - 32 mmol/L  ? Glucose, Bld 94 70 - 99 mg/dL  ? BUN 10 6 - 20 mg/dL  ? Creatinine, Ser 0.78 0.44 - 1.00 mg/dL  ? Calcium 9.3 8.9 - 10.3 mg/dL  ? Total Protein 6.4 (L) 6.5 - 8.1 g/dL  ? Albumin 3.6 3.5 - 5.0 g/dL  ? AST 16 15 - 41 U/L  ? ALT 18 0 - 44 U/L  ? Alkaline Phosphatase 50 38 - 126 U/L  ? Total Bilirubin 0.5 0.3 - 1.2 mg/dL  ? GFR, Estimated >60 >60 mL/min  ? Anion gap 4 (L) 5 - 15  ?hCG, quantitative, pregnancy     Status: Abnormal  ? Collection Time: 05/24/21 11:16 AM  ?Result Value Ref Range  ? hCG, Beta Chain, Quant, S 129,938 (H) <5 mIU/mL  ? ?US OB LESS THAN 14 WEEKS WITH OB TRANSVAGINAL ? ?Result Date: 05/24/2021 ?CLINICAL DATA:  Bleeding for 1 week.  First-trimester pregnancy. EXAM: OBSTETRIC <14 WK US AND TRANSVAGINAL OB US TECHNIQUE: Both transabdominal and transvaginal ultrasound examinations were performed for complete evaluation of the gestation as well as the maternal uterus, adnexal regions, and pelvic cul-de-sac. Transvaginal technique was performed to assess early pregnancy. COMPARISON:  None for this pregnancy. FINDINGS: Intrauterine gestational sac: Single Yolk sac:  Single Embryo:  Single Cardiac Activity: Present Heart Rate: 126 bpm CRL:  6.5 mm   6 w   3 d                  US EDC: 01/14/2022 Subchorionic hemorrhage: A small subchorionic hemorrhage encompasses less than 25% of the gestational sac. Maternal uterus/adnexae: Corpus luteal cyst noted on the right. Adnexa otherwise unremarkable. A small amount of free fluid is present. IMPRESSION: 1. Single intrauterine pregnancy with estimated gestational age of [redacted] weeks and 3 days. 2. Small subchorionic hemorrhage. Electronically Signed   By: Marin Robertshristopher  Mattern M.D.   On: 05/24/2021 12:29   ? ?MAU Course  ?Procedures ?Reglan ?Scopolamine ? ?MDM ?Labs and ultrasound ordered and reviewed.  Normal IUP on ultrasound with small SCH.  feeling better after medication.  No further emesis.  Tolerating ginger ale and crackers.  Stable for discharge. ? ?Assessment and Plan  ? ?1. Normal intrauterine pregnancy on prenatal ultrasound in first

## 2021-05-25 ENCOUNTER — Inpatient Hospital Stay (HOSPITAL_COMMUNITY)
Admission: AD | Admit: 2021-05-25 | Discharge: 2021-05-26 | Disposition: A | Discharge status: Home / Self Care | Payer: Medicaid Other | Attending: Obstetrics and Gynecology | Admitting: Obstetrics and Gynecology

## 2021-05-25 ENCOUNTER — Encounter (HOSPITAL_COMMUNITY): Payer: Self-pay | Admitting: Obstetrics and Gynecology

## 2021-05-25 ENCOUNTER — Inpatient Hospital Stay (HOSPITAL_COMMUNITY): Payer: Medicaid Other

## 2021-05-25 DIAGNOSIS — O26891 Other specified pregnancy related conditions, first trimester: Secondary | ICD-10-CM | POA: Diagnosis present

## 2021-05-25 DIAGNOSIS — E860 Dehydration: Secondary | ICD-10-CM

## 2021-05-25 DIAGNOSIS — A599 Trichomoniasis, unspecified: Secondary | ICD-10-CM

## 2021-05-25 DIAGNOSIS — Z87891 Personal history of nicotine dependence: Secondary | ICD-10-CM | POA: Insufficient documentation

## 2021-05-25 DIAGNOSIS — R112 Nausea with vomiting, unspecified: Secondary | ICD-10-CM

## 2021-05-25 DIAGNOSIS — R102 Pelvic and perineal pain: Secondary | ICD-10-CM | POA: Diagnosis not present

## 2021-05-25 DIAGNOSIS — R197 Diarrhea, unspecified: Secondary | ICD-10-CM | POA: Diagnosis not present

## 2021-05-25 DIAGNOSIS — Z3A01 Less than 8 weeks gestation of pregnancy: Secondary | ICD-10-CM | POA: Insufficient documentation

## 2021-05-25 DIAGNOSIS — O210 Mild hyperemesis gravidarum: Secondary | ICD-10-CM | POA: Diagnosis not present

## 2021-05-25 DIAGNOSIS — O209 Hemorrhage in early pregnancy, unspecified: Secondary | ICD-10-CM

## 2021-05-25 LAB — URINALYSIS, ROUTINE W REFLEX MICROSCOPIC
Bacteria, UA: NONE SEEN
Bilirubin Urine: NEGATIVE
Glucose, UA: NEGATIVE mg/dL
Ketones, ur: 80 mg/dL — AB
Leukocytes,Ua: NEGATIVE
Nitrite: NEGATIVE
Protein, ur: 100 mg/dL — AB
RBC / HPF: >50 RBC/hpf — ABNORMAL HIGH (ref 0–5)
Specific Gravity, Urine: 1.028 (ref 1.005–1.030)
pH: 5 (ref 5.0–8.0)

## 2021-05-25 LAB — WET PREP, GENITAL
Sperm: NONE SEEN
WBC, Wet Prep HPF POC: >=10 — AB (ref <10)
Yeast Wet Prep HPF POC: NONE SEEN

## 2021-05-25 LAB — CULTURE, OB URINE

## 2021-05-25 MED ORDER — METRONIDAZOLE 500 MG/100ML IV SOLN
500.0000 mg | Freq: Once | INTRAVENOUS | Status: AC
  Administered 2021-05-25: 500 mg via INTRAVENOUS
  Filled 2021-05-25: qty 100

## 2021-05-25 MED ORDER — LACTATED RINGERS IV BOLUS
1000.0000 mL | Freq: Once | INTRAVENOUS | Status: AC
  Administered 2021-05-25: 1000 mL via INTRAVENOUS

## 2021-05-25 MED ORDER — SODIUM CHLORIDE 0.9 % IV SOLN
8.0000 mg | Freq: Once | INTRAVENOUS | Status: AC
  Administered 2021-05-25: 8 mg via INTRAVENOUS
  Filled 2021-05-25: qty 4

## 2021-05-25 MED ORDER — SCOPOLAMINE 1 MG/3DAYS TD PT72
1.0000 | MEDICATED_PATCH | Freq: Once | TRANSDERMAL | Status: DC
  Filled 2021-05-25: qty 1

## 2021-05-25 MED ORDER — FAMOTIDINE IN NACL 20-0.9 MG/50ML-% IV SOLN
20.0000 mg | Freq: Once | INTRAVENOUS | Status: AC
  Administered 2021-05-25: 20 mg via INTRAVENOUS
  Filled 2021-05-25: qty 50

## 2021-05-25 MED ORDER — PROMETHAZINE HCL 25 MG/ML IJ SOLN
25.0000 mg | Freq: Once | INTRAVENOUS | Status: AC
  Administered 2021-05-25: 25 mg via INTRAVENOUS
  Filled 2021-05-25: qty 1

## 2021-05-25 NOTE — MAU Note (Signed)
Mackenzie Allen is a 26 y.o. at [redacted]w[redacted]d here in MAU reporting: was here yesterday, things are getting worse, bleeding (filled pantiliner x2 today)more, more pain.  Diarrhea started today-loose, throwing up more, medicine is not working.  ? ?Onset of complaint: last wk ?Pain score: 5 ?Vitals:  ? 05/25/21 1743 05/25/21 1803  ?BP: (!) 149/90 (!) 174/94  ?Pulse: 64 (!) 55  ?Resp: 18 18  ?Temp: (!) 97.3 ?F (36.3 ?C)   ?SpO2: 99% 100%  ?   ?Lab orders placed from triage:   ?

## 2021-05-25 NOTE — MAU Provider Note (Addendum)
?History  ?  ? ?CSN: [id number] ? ?Arrival date and time: 05/25/21 1706 ? ? Event Date/Time  ? First Provider Initiated Contact with Patient 05/25/21 1851   ?  ? ?Chief Complaint  ?Patient presents with  ? Abdominal Pain  ? Vaginal Bleeding  ? Emesis  ? Diarrhea  ? ?HPI ? ?Ms. Mackenzie Allen is a 26 y.o. year old G9P1001 female at [redacted]w[redacted]d weeks gestation who presents to MAU reporting diarrhea (loose stools), continued vomiting and abdominal pain. She also reports she had VB that "filled up pantiliner x 2 today, but no VB noted upon arrival to MAU this evening. She was seen in MAU yesterday: had IUP @ 6.3 weeks with YS, embryo, (+) cardiac activity with small [facility] that encompasses <25% of gestational sac. She is wearing a scopolamine patch and states "it is not working." ?  ?OB History   ? ? Gravida  ?2  ? Para  ?1  ? Term  ?1  ? Preterm  ?   ? AB  ?   ? Living  ?1  ?  ? ? SAB  ?   ? IAB  ?   ? Ectopic  ?   ? Multiple  ?0  ? Live Births  ?1  ?   ?  ?  ? ? ?Past Medical History:  ?Diagnosis Date  ? Herpes genitalia   ? Hypertension   ? Medical history non-contributory   ? ? ?Past Surgical History:  ?Procedure Laterality Date  ? WRIST SURGERY    ? ? ?Family History  ?Problem Relation Age of Onset  ? Asthma Mother   ? Varicose Veins Mother   ? Asthma Brother   ? Asthma Maternal Grandmother   ? Diabetes Maternal Grandmother   ? Heart disease Maternal Grandmother   ? Hypertension Maternal Grandmother   ? Arthritis Paternal Grandmother   ? ? ?Social History  ? ?Tobacco Use  ? Smoking status: Former  ?  Types: Cigars  ? Smokeless tobacco: Never  ? Tobacco comments:  ?  1 Cigar a day   ?Vaping Use  ? Vaping Use: Never used  ?Substance Use Topics  ? Alcohol use: Not Currently  ? Drug use: Not Currently  ?  Types: Marijuana  ?  Comment: not since becoming pregnant  ? ? ?Allergies: No Known Allergies ? ?Medications Prior to Admission  ?Medication Sig Dispense Refill Last Dose  ? famotidine (PEPCID) 20 MG tablet Take  1 tablet (20 mg total) by mouth at bedtime. 30 tablet 0 05/25/2021  ? metoCLOPramide (REGLAN) 10 MG tablet Take 1 tablet (10 mg total) by mouth every 6 (six) hours as needed for nausea or vomiting. 30 tablet 1 05/25/2021  ? [START ON 05/27/2021] scopolamine (TRANSDERM-SCOP) 1 MG/3DAYS Place 1 patch (1.5 mg total) onto the skin every 3 (three) days. 10 patch 1 05/24/2021  ? acetaminophen (TYLENOL) 325 MG tablet Take 2 tablets (650 mg total) by mouth every 4 (four) hours as needed (for pain scale < 4). (Patient not taking: Reported on 10/05/2020)     ? Prenatal Vit-Fe Fumarate-FA (MULTIVITAMIN-PRENATAL) 27-0.8 MG TABS tablet Take 1 tablet by mouth daily at 12 noon.     ? valACYclovir (VALTREX) 1000 MG tablet Take 1 tablet (1,000 mg total) by mouth 2 (two) times daily. (Patient not taking: Reported on 10/05/2020) 20 tablet 1   ? ? ?Review of Systems  ?Constitutional: Negative.   ?HENT: Negative.    ?Eyes: Negative.   ?Respiratory: Negative.    ?  Cardiovascular: Negative.   ?Gastrointestinal:  Positive for nausea and vomiting (actively vomiting now; "Medications not working. Zofran didn't work for me in my last pregnancy.").  ?Endocrine: Negative.   ?Genitourinary:  Positive for pelvic pain and vaginal bleeding.  ?Musculoskeletal: Negative.   ?Skin: Negative.   ?Allergic/Immunologic: Negative.   ?Neurological: Negative.   ?Hematological: Negative.   ?Psychiatric/Behavioral: Negative.    ?Physical Exam  ? ?Blood pressure (!) 143/77, pulse (!) 54, temperature (!) 97.3 ?F (36.3 ?C), temperature source Axillary, resp. rate 18, height 5\' 7"  (1.702 m), weight 66.6 kg, last menstrual period 04/07/2021, SpO2 100 %, not currently breastfeeding. ? ?Physical Exam ?Vitals and nursing note reviewed.  ?Constitutional:   ?   Appearance: Normal appearance. She is normal weight.  ?Abdominal:  ?   General: Bowel sounds are decreased.  ?   Palpations: Abdomen is soft.  ?Genitourinary: ?   Comments: Patient denies bleeding since arriving to  MAU ?Skin: ?   General: Skin is warm and dry.  ?Neurological:  ?   Mental Status: She is alert and oriented to person, place, and time.  ?Psychiatric:     ?   Mood and Affect: Mood normal.     ?   Behavior: Behavior normal.     ?   Thought Content: Thought content normal.     ?   Judgment: Judgment normal.  ? ? ?MAU Course  ?Procedures ? ?MDM ?IVFs: LR 1000 ml @ 999 ml/hr; followed by Phenergan 25 mg in LR 1000 ml @ 999 ml/hr -- reli nausea/vomiting ?Zofran 8 mg IVPB  ?Pepcid 20 mg IVPB ?PO Challenge  ?OB < 14 wks U/S for VB and increased pelvic pain ? ?Results for orders placed or performed during the hospital encounter of 05/25/21 (from the past 24 hour(s))  ?Urinalysis, Routine w reflex microscopic Urine, Clean Catch     Status: Abnormal  ? Collection Time: 05/25/21  5:47 PM  ?Result Value Ref Range  ? Color, Urine YELLOW YELLOW  ? APPearance HAZY (A) CLEAR  ? Specific Gravity, Urine 1.028 1.005 - 1.030  ? pH 5.0 5.0 - 8.0  ? Glucose, UA NEGATIVE NEGATIVE mg/dL  ? Hgb urine dipstick SMALL (A) NEGATIVE  ? Bilirubin Urine NEGATIVE NEGATIVE  ? Ketones, ur 80 (A) NEGATIVE mg/dL  ? Protein, ur 100 (A) NEGATIVE mg/dL  ? Nitrite NEGATIVE NEGATIVE  ? Leukocytes,Ua NEGATIVE NEGATIVE  ? RBC / HPF >50 (H) 0 - 5 RBC/hpf  ? WBC, UA 0-5 0 - 5 WBC/hpf  ? Bacteria, UA NONE SEEN NONE SEEN  ? Squamous Epithelial / LPF 6-10 0 - 5  ? Mucus PRESENT   ?  ?Report given to and care assumed by 07/25/21, CNM @ 2100 ? ?2101, CNM ?05/25/2021, 6:59 PM ?Assessment and Plan  ? Reassessment (10:10 PM) ? ?-Nurse reports patient with improvement in N/V.  However new complaint of abdominal cramping/pain. ?-Reports patient offered tylenol by previous provider, but declined. ?-Nurse instructed to have patient swab for STI/STD for rule out. ?-Report given to M. 07/25/2021, CNM for assumption of care.  ? ?Mayford Knife MSN, CNM ?[facility] ? ?Total of two liters of fluid infused ?Pepcid and  antiemetics given ?Flagyl 500mg  IV given as first dose for new finding of Trichomonas (recurrent) ? Discharged home ?Rx Flagyl for trich ?Rx Zofran and Phenergan for nausea ?Advance diet as tolerated ?Encouraged to return if she develops worsening of symptoms, increase in pain, fever, or  other concerning symptoms.  ? ?Aviva Signs, CNM ? ?

## 2021-05-25 NOTE — MAU Provider Note (Incomplete Revision)
?History  ?  ? ?CSN: [id number] ? ?Arrival date and time: 05/25/21 1706 ? ? Event Date/Time  ? First Provider Initiated Contact with Patient 05/25/21 1851   ?  ? ?Chief Complaint  ?Patient presents with  ? Abdominal Pain  ? Vaginal Bleeding  ? Emesis  ? Diarrhea  ? ?HPI ? ?Ms. Mackenzie Allen is a 26 y.o. year old G9P1001 female at [redacted]w[redacted]d weeks gestation who presents to MAU reporting diarrhea (loose stools), continued vomiting and abdominal pain. She also reports she had VB that "filled up pantiliner x 2 today, but no VB noted upon arrival to MAU this evening. She was seen in MAU yesterday: had IUP @ 6.3 weeks with YS, embryo, (+) cardiac activity with small [facility] that encompasses <25% of gestational sac. She is wearing a scopolamine patch and states "it is not working." ?  ?OB History   ? ? Gravida  ?2  ? Para  ?1  ? Term  ?1  ? Preterm  ?   ? AB  ?   ? Living  ?1  ?  ? ? SAB  ?   ? IAB  ?   ? Ectopic  ?   ? Multiple  ?0  ? Live Births  ?1  ?   ?  ?  ? ? ?Past Medical History:  ?Diagnosis Date  ? Herpes genitalia   ? Hypertension   ? Medical history non-contributory   ? ? ?Past Surgical History:  ?Procedure Laterality Date  ? WRIST SURGERY    ? ? ?Family History  ?Problem Relation Age of Onset  ? Asthma Mother   ? Varicose Veins Mother   ? Asthma Brother   ? Asthma Maternal Grandmother   ? Diabetes Maternal Grandmother   ? Heart disease Maternal Grandmother   ? Hypertension Maternal Grandmother   ? Arthritis Paternal Grandmother   ? ? ?Social History  ? ?Tobacco Use  ? Smoking status: Former  ?  Types: Cigars  ? Smokeless tobacco: Never  ? Tobacco comments:  ?  1 Cigar a day   ?Vaping Use  ? Vaping Use: Never used  ?Substance Use Topics  ? Alcohol use: Not Currently  ? Drug use: Not Currently  ?  Types: Marijuana  ?  Comment: not since becoming pregnant  ? ? ?Allergies: No Known Allergies ? ?Medications Prior to Admission  ?Medication Sig Dispense Refill Last Dose  ? famotidine (PEPCID) 20 MG tablet Take  1 tablet (20 mg total) by mouth at bedtime. 30 tablet 0 05/25/2021  ? metoCLOPramide (REGLAN) 10 MG tablet Take 1 tablet (10 mg total) by mouth every 6 (six) hours as needed for nausea or vomiting. 30 tablet 1 05/25/2021  ? [START ON 05/27/2021] scopolamine (TRANSDERM-SCOP) 1 MG/3DAYS Place 1 patch (1.5 mg total) onto the skin every 3 (three) days. 10 patch 1 05/24/2021  ? acetaminophen (TYLENOL) 325 MG tablet Take 2 tablets (650 mg total) by mouth every 4 (four) hours as needed (for pain scale < 4). (Patient not taking: Reported on 10/05/2020)     ? Prenatal Vit-Fe Fumarate-FA (MULTIVITAMIN-PRENATAL) 27-0.8 MG TABS tablet Take 1 tablet by mouth daily at 12 noon.     ? valACYclovir (VALTREX) 1000 MG tablet Take 1 tablet (1,000 mg total) by mouth 2 (two) times daily. (Patient not taking: Reported on 10/05/2020) 20 tablet 1   ? ? ?Review of Systems  ?Constitutional: Negative.   ?HENT: Negative.    ?Eyes: Negative.   ?Respiratory: Negative.    ?  Cardiovascular: Negative.   ?Gastrointestinal:  Positive for nausea and vomiting (actively vomiting now; "Medications not working. Zofran didn't work for me in my last pregnancy.").  ?Endocrine: Negative.   ?Genitourinary:  Positive for pelvic pain and vaginal bleeding.  ?Musculoskeletal: Negative.   ?Skin: Negative.   ?Allergic/Immunologic: Negative.   ?Neurological: Negative.   ?Hematological: Negative.   ?Psychiatric/Behavioral: Negative.    ?Physical Exam  ? ?Blood pressure (!) 143/77, pulse (!) 54, temperature (!) 97.3 ?F (36.3 ?C), temperature source Axillary, resp. rate 18, height 5\' 7"  (1.702 m), weight 66.6 kg, last menstrual period 04/07/2021, SpO2 100 %, not currently breastfeeding. ? ?Physical Exam ?Vitals and nursing note reviewed.  ?Constitutional:   ?   Appearance: Normal appearance. She is normal weight.  ?Abdominal:  ?   General: Bowel sounds are decreased.  ?   Palpations: Abdomen is soft.  ?Genitourinary: ?   Comments: Patient denies bleeding since arriving to  MAU ?Skin: ?   General: Skin is warm and dry.  ?Neurological:  ?   Mental Status: She is alert and oriented to person, place, and time.  ?Psychiatric:     ?   Mood and Affect: Mood normal.     ?   Behavior: Behavior normal.     ?   Thought Content: Thought content normal.     ?   Judgment: Judgment normal.  ? ? ?MAU Course  ?Procedures ? ?MDM ?IVFs: LR 1000 ml @ 999 ml/hr; followed by Phenergan 25 mg in LR 1000 ml @ 999 ml/hr -- reli nausea/vomiting ?Zofran 8 mg IVPB  ?Pepcid 20 mg IVPB ?PO Challenge  ?OB < 14 wks U/S for VB and increased pelvic pain ? ?Results for orders placed or performed during the hospital encounter of 05/25/21 (from the past 24 hour(s))  ?Urinalysis, Routine w reflex microscopic Urine, Clean Catch     Status: Abnormal  ? Collection Time: 05/25/21  5:47 PM  ?Result Value Ref Range  ? Color, Urine YELLOW YELLOW  ? APPearance HAZY (A) CLEAR  ? Specific Gravity, Urine 1.028 1.005 - 1.030  ? pH 5.0 5.0 - 8.0  ? Glucose, UA NEGATIVE NEGATIVE mg/dL  ? Hgb urine dipstick SMALL (A) NEGATIVE  ? Bilirubin Urine NEGATIVE NEGATIVE  ? Ketones, ur 80 (A) NEGATIVE mg/dL  ? Protein, ur 100 (A) NEGATIVE mg/dL  ? Nitrite NEGATIVE NEGATIVE  ? Leukocytes,Ua NEGATIVE NEGATIVE  ? RBC / HPF >50 (H) 0 - 5 RBC/hpf  ? WBC, UA 0-5 0 - 5 WBC/hpf  ? Bacteria, UA NONE SEEN NONE SEEN  ? Squamous Epithelial / LPF 6-10 0 - 5  ? Mucus PRESENT   ?  ?Report given to and care assumed by 07/25/21, CNM @ 2100 ? ?2101, CNM ?05/25/2021, 6:59 PM ?Assessment and Plan  ? Reassessment (10:10 PM) ? ?-Nurse reports patient with improvement in N/V.  However new complaint of abdominal cramping/pain. ?-Reports patient offered tylenol by previous provider, but declined. ?-Nurse instructed to have patient swab for STI/STD for rule out. ?-Report given to M. 07/25/2021, CNM for assumption of care.  ? ?Mayford Knife MSN, CNM ?[facility], [facility] ? ?

## 2021-05-26 DIAGNOSIS — E860 Dehydration: Secondary | ICD-10-CM

## 2021-05-26 DIAGNOSIS — R197 Diarrhea, unspecified: Secondary | ICD-10-CM

## 2021-05-26 DIAGNOSIS — A599 Trichomoniasis, unspecified: Secondary | ICD-10-CM

## 2021-05-26 DIAGNOSIS — R102 Pelvic and perineal pain: Secondary | ICD-10-CM

## 2021-05-26 DIAGNOSIS — O209 Hemorrhage in early pregnancy, unspecified: Secondary | ICD-10-CM

## 2021-05-26 DIAGNOSIS — Z3A01 Less than 8 weeks gestation of pregnancy: Secondary | ICD-10-CM

## 2021-05-26 DIAGNOSIS — R112 Nausea with vomiting, unspecified: Secondary | ICD-10-CM

## 2021-05-26 DIAGNOSIS — O26891 Other specified pregnancy related conditions, first trimester: Secondary | ICD-10-CM

## 2021-05-26 LAB — GC/CHLAMYDIA PROBE AMP (~~LOC~~) NOT AT ARMC
Chlamydia: NEGATIVE
Comment: NEGATIVE
Comment: NORMAL
Neisseria Gonorrhea: NEGATIVE

## 2021-05-26 MED ORDER — PROMETHAZINE HCL 25 MG PO TABS: 25.0000 mg | ORAL_TABLET | Freq: Four times a day (QID) | ORAL | 2 refills | Status: DC | PRN

## 2021-05-26 MED ORDER — METRONIDAZOLE 500 MG PO TABS: 500.0000 mg | ORAL_TABLET | Freq: Two times a day (BID) | ORAL | 0 refills | Status: AC

## 2021-05-26 MED ORDER — ONDANSETRON 4 MG PO TBDP
4.0000 mg | ORAL_TABLET | Freq: Four times a day (QID) | ORAL | 0 refills | Status: DC | PRN
Start: 2021-05-26 — End: 2021-06-15

## 2021-05-27 ENCOUNTER — Encounter (HOSPITAL_COMMUNITY): Payer: Self-pay | Admitting: Obstetrics and Gynecology

## 2021-05-27 ENCOUNTER — Inpatient Hospital Stay (HOSPITAL_COMMUNITY)
Admission: AD | Admit: 2021-05-27 | Discharge: 2021-05-27 | Disposition: A | Discharge status: Home / Self Care | Payer: Medicaid Other | Attending: Obstetrics and Gynecology | Admitting: Obstetrics and Gynecology

## 2021-05-27 DIAGNOSIS — E860 Dehydration: Secondary | ICD-10-CM | POA: Diagnosis not present

## 2021-05-27 DIAGNOSIS — O219 Vomiting of pregnancy, unspecified: Secondary | ICD-10-CM | POA: Diagnosis not present

## 2021-05-27 DIAGNOSIS — Z711 Person with feared health complaint in whom no diagnosis is made: Secondary | ICD-10-CM | POA: Diagnosis not present

## 2021-05-27 DIAGNOSIS — O009 Unspecified ectopic pregnancy without intrauterine pregnancy: Secondary | ICD-10-CM | POA: Insufficient documentation

## 2021-05-27 DIAGNOSIS — Z3A01 Less than 8 weeks gestation of pregnancy: Secondary | ICD-10-CM | POA: Diagnosis not present

## 2021-05-27 DIAGNOSIS — O209 Hemorrhage in early pregnancy, unspecified: Secondary | ICD-10-CM | POA: Diagnosis not present

## 2021-05-27 DIAGNOSIS — R112 Nausea with vomiting, unspecified: Secondary | ICD-10-CM | POA: Diagnosis not present

## 2021-05-27 LAB — URINALYSIS, ROUTINE W REFLEX MICROSCOPIC
Bilirubin Urine: NEGATIVE
Glucose, UA: NEGATIVE mg/dL
Ketones, ur: 80 mg/dL — AB
Nitrite: NEGATIVE
Protein, ur: NEGATIVE mg/dL
Specific Gravity, Urine: 1.02 (ref 1.005–1.030)
pH: 8 (ref 5.0–8.0)

## 2021-05-27 LAB — CULTURE, OB URINE

## 2021-05-27 NOTE — MAU Provider Note (Signed)
?History  ?  ? ?CSN: [id number] ? ?Arrival date and time: 05/27/21 0920 ? ? None  ?  ? ?Chief Complaint  ?Patient presents with  ? Vaginal Bleeding  ? Emesis  ? ?Mackenzie Allen, 26yo G2P1001 @ [redacted]w[redacted]d by [place] present for abdominal pain and vaginal bleeding. She also c/o nausea and vomiting. Pt states that she was seen on 05/25/21 for the same complaints. She was worked up with an ultrasound and was told that she had an "ectopic pregnancy" and needs to "follow up with her OB." Pt states this was anunplanned pregnancy d/t last baby is 37 months old.  She states she has appt scheduled for 06/02/21.  Pt expresses frustration, because she feels as though "no one is telling her anything, just sending her home."  Pt dry heaving during interaction. Denies saturating more than a pad per hour. Pt and family visibly upset. Pt mother at bedside, contributing to HPI.   ? ? ? ?OB History   ? ? Gravida  ?2  ? Para  ?1  ? Term  ?1  ? Preterm  ?   ? AB  ?   ? Living  ?1  ?  ? ? SAB  ?   ? IAB  ?   ? Ectopic  ?   ? Multiple  ?0  ? Live Births  ?1  ?   ?  ?  ? ? ?Past Medical History:  ?Diagnosis Date  ? Herpes genitalia   ? Hypertension   ? Medical history non-contributory   ? ? ?Past Surgical History:  ?Procedure Laterality Date  ? WRIST SURGERY    ? ? ?Family History  ?Problem Relation Age of Onset  ? Asthma Mother   ? Varicose Veins Mother   ? Asthma Brother   ? Asthma Maternal Grandmother   ? Diabetes Maternal Grandmother   ? Heart disease Maternal Grandmother   ? Hypertension Maternal Grandmother   ? Arthritis Paternal Grandmother   ? ? ?Social History  ? ?Tobacco Use  ? Smoking status: Former  ?  Types: Cigars  ? Smokeless tobacco: Never  ? Tobacco comments:  ?  1 Cigar a day   ?Vaping Use  ? Vaping Use: Never used  ?Substance Use Topics  ? Alcohol use: Not Currently  ? Drug use: Not Currently  ?  Types: Marijuana  ?  Comment: not since becoming pregnant  ? ? ?Allergies: No Known Allergies ? ?Medications Prior to Admission  ?Medication Sig  Dispense Refill Last Dose  ? Prenatal Vit-Fe Fumarate-FA (MULTIVITAMIN-PRENATAL) 27-0.8 MG TABS tablet Take 1 tablet by mouth daily at 12 noon.   05/26/2021  ? scopolamine (TRANSDERM-SCOP) 1 MG/3DAYS Place 1 patch (1.5 mg total) onto the skin every 3 (three) days. 10 patch 1 Past Week  ? acetaminophen (TYLENOL) 325 MG tablet Take 2 tablets (650 mg total) by mouth every 4 (four) hours as needed (for pain scale < 4). (Patient not taking: Reported on 10/05/2020)     ? famotidine (PEPCID) 20 MG tablet Take 1 tablet (20 mg total) by mouth at bedtime. 30 tablet 0   ? metoCLOPramide (REGLAN) 10 MG tablet Take 1 tablet (10 mg total) by mouth every 6 (six) hours as needed for nausea or vomiting. 30 tablet 1   ? metroNIDAZOLE (FLAGYL) 500 MG tablet Take 1 tablet (500 mg total) by mouth 2 (two) times daily for 7 days. 14 tablet 0   ? ondansetron (ZOFRAN-ODT) 4 MG disintegrating tablet Take 1 tablet (4  mg total) by mouth every 6 (six) hours as needed for nausea. 20 tablet 0   ? promethazine (PHENERGAN) 25 MG tablet Take 1 tablet (25 mg total) by mouth every 6 (six) hours as needed for nausea or vomiting. 30 tablet 2   ? ? ?Review of Systems  ?Gastrointestinal:  Positive for abdominal pain, nausea and vomiting.  ?Genitourinary:  Positive for vaginal bleeding.  ?Physical Exam  ? ?Blood pressure (!) 133/97, pulse (!) 55, temperature 98.4 ?F (36.9 ?C), resp. rate 18, last menstrual period 04/07/2021, not currently breastfeeding. ? ?Physical Exam ?Vitals and nursing note reviewed.  ?Constitutional:   ?   General: She is not in acute distress. ?   Appearance: Normal appearance.  ?HENT:  ?   Head: Normocephalic.  ?Cardiovascular:  ?   Rate and Rhythm: Normal rate and regular rhythm.  ?Pulmonary:  ?   Effort: Pulmonary effort is normal. No respiratory distress.  ?Musculoskeletal:     ?   General: Normal range of motion.  ?   Cervical back: Normal range of motion.  ?Skin: ?   General: Skin is warm and dry.  ?Neurological:  ?   Mental  Status: She is alert.  ?Psychiatric:     ?   Mood and Affect: Mood normal.     ?   Behavior: Behavior normal.  ? ? ?MAU Course  ?CNM reviewed previous Ultrasounds with pt and family with pt permission. Both Previous US Results revealed Normal IUP with subchorionic hemorrhage, with no significant changes with 2 days difference. Pt and family tearful, CNM stepped out to respect the processing of information.  ? ?CNM returned to complete MAU MSE. Pt complaints addressed- Normal pregnancy symptoms and expectations based on luteal cyst and subchorionic hemorrhage. Questions answered regarding next steps for NOB if desired to continue with pregnancy and resources offered if desires termination. With permission, CNM discussed +trich and BV results from last encounter and pt became tearful and asked CNM to step out again.          ? ?Patient desires immediate discharge. CNM reminded pt that Rx were at the pharmacy and to notify sexual partners. Pt mother stepped out to desk to ask for depression resources. ?  ?Assessment and Plan  ?Bedside ultrasound offered, pt declined ?Discharge in Stable condition.  ?Trich and N/V- meds at outpatient pharmacy.  ?Referral placed for Behavioral services.  ? ?Claudette Head, CNM  ?05/27/2021, 10:13 AM  ?

## 2021-05-27 NOTE — MAU Note (Signed)
.  Mackenzie Allen is a 26 y.o. at [redacted]w[redacted]d here in MAU reporting: started having vaginal bleeding  this morning with abd pain. Also c/o continues n/v. Medications are not working.  ? ?Onset of complaint: 5:30 ?Pain score: 7 ?Vitals:  ? 05/27/21 0948  ?BP: (!) 133/97  ?Pulse: (!) 55  ?Resp: 18  ?Temp: 98.4 ?F (36.9 ?C)  ?   ?FHT:n/a ?Lab orders placed from triage:  u/a ? ?

## 2021-06-14 ENCOUNTER — Encounter (HOSPITAL_COMMUNITY): Payer: Self-pay | Admitting: Emergency Medicine

## 2021-06-14 ENCOUNTER — Inpatient Hospital Stay (HOSPITAL_COMMUNITY)
Admission: AD | Admit: 2021-06-14 | Discharge: 2021-06-15 | Disposition: A | Discharge status: Home / Self Care | Payer: Medicaid Other | Attending: Obstetrics and Gynecology | Admitting: Obstetrics and Gynecology

## 2021-06-14 ENCOUNTER — Other Ambulatory Visit: Payer: Self-pay

## 2021-06-14 ENCOUNTER — Inpatient Hospital Stay (HOSPITAL_COMMUNITY): Payer: Medicaid Other

## 2021-06-14 DIAGNOSIS — O10911 Unspecified pre-existing hypertension complicating pregnancy, first trimester: Secondary | ICD-10-CM | POA: Insufficient documentation

## 2021-06-14 DIAGNOSIS — Z3A09 9 weeks gestation of pregnancy: Secondary | ICD-10-CM | POA: Diagnosis not present

## 2021-06-14 DIAGNOSIS — Z9889 Other specified postprocedural states: Secondary | ICD-10-CM | POA: Diagnosis not present

## 2021-06-14 DIAGNOSIS — O209 Hemorrhage in early pregnancy, unspecified: Secondary | ICD-10-CM | POA: Insufficient documentation

## 2021-06-14 LAB — COMPREHENSIVE METABOLIC PANEL
ALT: 11 U/L (ref 0–44)
AST: 12 U/L — ABNORMAL LOW (ref 15–41)
Albumin: 3.7 g/dL (ref 3.5–5.0)
Alkaline Phosphatase: 47 U/L (ref 38–126)
Anion gap: 10 (ref 5–15)
BUN: 16 mg/dL (ref 6–20)
CO2: 22 mmol/L (ref 22–32)
Calcium: 9.1 mg/dL (ref 8.9–10.3)
Chloride: 107 mmol/L (ref 98–111)
Creatinine, Ser: 0.73 mg/dL (ref 0.44–1.00)
GFR, Estimated: >60 mL/min (ref >60)
Glucose, Bld: 96 mg/dL (ref 70–99)
Potassium: 3.5 mmol/L (ref 3.5–5.1)
Sodium: 139 mmol/L (ref 135–145)
Total Bilirubin: 0.3 mg/dL (ref 0.3–1.2)
Total Protein: 6.6 g/dL (ref 6.5–8.1)

## 2021-06-14 LAB — CBC WITH DIFFERENTIAL/PLATELET
Abs Immature Granulocytes: 0.06 10*3/uL (ref 0.00–0.07)
Basophils Absolute: 0 10*3/uL (ref 0.0–0.1)
Basophils Relative: 0 %
Eosinophils Absolute: 0 10*3/uL (ref 0.0–0.5)
Eosinophils Relative: 0 %
HCT: 32.6 % — ABNORMAL LOW (ref 36.0–46.0)
Hemoglobin: 10.8 g/dL — ABNORMAL LOW (ref 12.0–15.0)
Immature Granulocytes: 1 %
Lymphocytes Relative: 17 %
Lymphs Abs: 1.8 10*3/uL (ref 0.7–4.0)
MCH: 30.5 pg (ref 26.0–34.0)
MCHC: 33.1 g/dL (ref 30.0–36.0)
MCV: 92.1 fL (ref 80.0–100.0)
Monocytes Absolute: 0.4 10*3/uL (ref 0.1–1.0)
Monocytes Relative: 3 %
Neutro Abs: 8.8 10*3/uL — ABNORMAL HIGH (ref 1.7–7.7)
Neutrophils Relative %: 79 %
Platelets: 280 10*3/uL (ref 150–400)
RBC: 3.54 MIL/uL — ABNORMAL LOW (ref 3.87–5.11)
RDW: 13.6 % (ref 11.5–15.5)
WBC: 11.1 10*3/uL — ABNORMAL HIGH (ref 4.0–10.5)
nRBC: 0 % (ref 0.0–0.2)

## 2021-06-14 LAB — I-STAT BETA HCG BLOOD, ED (MC, WL, AP ONLY): I-stat hCG, quantitative: >2000 m[IU]/mL — ABNORMAL HIGH (ref <5)

## 2021-06-14 LAB — TYPE AND SCREEN
ABO/RH(D): O POS
Antibody Screen: NEGATIVE

## 2021-06-14 LAB — PROTIME-INR
INR: 1.2 (ref 0.8–1.2)
Prothrombin Time: 14.9 seconds (ref 11.4–15.2)

## 2021-06-14 LAB — HCG, QUANTITATIVE, PREGNANCY: hCG, Beta Chain, Quant, S: 3474 m[IU]/mL — ABNORMAL HIGH (ref <5)

## 2021-06-14 NOTE — ED Provider Triage Note (Signed)
Emergency Medicine Provider Triage Evaluation Note  Mackenzie Allen , a 26 y.o. female  was evaluated in triage.  Pt complains of severe vaginal bleeding following a PO abortion on 5/18.  Pt took 2 days of the prescribed medication and has been taking ibuprofen daily for mild suprapubic tenderness.  Was doing her daily routine today, when she stood up and had blood gushing out from her vagina.  Stated "I have blood enough to clog a bathtub drain".  Denies dizziness, lightheadedness, syncope.  Endorses mild accompanying headache.  Review of Systems  Positive:  Negative: As above  Physical Exam  BP (!) 135/107   Pulse (!) 110   Temp 99.5 F (37.5 C) (Oral)   Resp 19   LMP 04/07/2021   SpO2 100%  Gen:   Awake, no distress   Resp:  Normal effort, CTAB MSK:   Moves extremities without difficulty  Other:  Umbilical and suprapubic tenderness.  Not actively bleeding at this time.  Medical Decision Making  Medically screening exam initiated at 10:01 PM.  Appropriate orders placed.  Mackenzie Allen was informed that the remainder of the evaluation will be completed by another provider, this initial triage assessment does not replace that evaluation, and the importance of remaining in the ED until their evaluation is complete.  Labs ordered.  Contacted MAU.  Return call pending.   Prince [place], [place] [id number]

## 2021-06-14 NOTE — ED Triage Notes (Signed)
Pt BIB [facility] from home, pt had an abortion 2 weeks ago, states she took medication and not a procedure. States that she has been having some vaginal bleeding, about 3hrs prior to EMS arrival pt began having heavy vaginal bleeding with large clots and abdominal cramping. Initially tachycardic in the 160s on EMS arrival, given 200ccNS, HR 110s. Reports cramping has subsided, c/o headache.

## 2021-06-14 NOTE — MAU Provider Note (Signed)
History     CSN: [id number]  Arrival date and time: 06/14/21 2148   Event Date/Time   First Provider Initiated Contact with Patient 06/14/21 2317      Chief Complaint  Patient presents with   Vaginal Bleeding   Mackenzie Allen is a 26 y.o. G2P1001 at [redacted]w[redacted]d, by LMP, who receives care at [facility].  She presents today, as a transfer from [facility], for Vaginal Bleeding after TAB.  She reports taking initial dose of medication on May 18 and her follow-up dose on May 19.  She states she started bleeding the same day around 1 PM, but it was light, with small clots about the size of a quarter.  She states tonight at 7 PM the bleeding increased and she was passing clots the size of a pineapple.  Patient reports after last clot was passed her bleeding and pain improved significantly.  Patient reports she was 8 weeks at time of abortion initiation.  She currently rates her pain a 2-3/10.   OB History     Gravida  2   Para  1   Term  1   Preterm      AB      Living  1      SAB      IAB      Ectopic      Multiple  0   Live Births  1           Past Medical History:  Diagnosis Date   Herpes genitalia    Hypertension    Medical history non-contributory     Past Surgical History:  Procedure Laterality Date   WRIST SURGERY      Family History  Problem Relation Age of Onset   Asthma Mother    Varicose Veins Mother    Asthma Brother    Asthma Maternal Grandmother    Diabetes Maternal Grandmother    Heart disease Maternal Grandmother    Hypertension Maternal Grandmother    Arthritis Paternal Grandmother     Social History   Tobacco Use   Smoking status: Former    Types: Cigars   Smokeless tobacco: Never   Tobacco comments:    1 Cigar a day   Vaping Use   Vaping Use: Never used  Substance Use Topics   Alcohol use: Not Currently   Drug use: Not Currently    Types: Marijuana    Comment: not since becoming pregnant    Allergies: No Known  Allergies  Medications Prior to Admission  Medication Sig Dispense Refill Last Dose   acetaminophen (TYLENOL) 325 MG tablet Take 2 tablets (650 mg total) by mouth every 4 (four) hours as needed (for pain scale < 4). (Patient not taking: Reported on 10/05/2020)      famotidine (PEPCID) 20 MG tablet Take 1 tablet (20 mg total) by mouth at bedtime. 30 tablet 0    metoCLOPramide (REGLAN) 10 MG tablet Take 1 tablet (10 mg total) by mouth every 6 (six) hours as needed for nausea or vomiting. 30 tablet 1    ondansetron (ZOFRAN-ODT) 4 MG disintegrating tablet Take 1 tablet (4 mg total) by mouth every 6 (six) hours as needed for nausea. 20 tablet 0    Prenatal Vit-Fe Fumarate-FA (MULTIVITAMIN-PRENATAL) 27-0.8 MG TABS tablet Take 1 tablet by mouth daily at 12 noon.      promethazine (PHENERGAN) 25 MG tablet Take 1 tablet (25 mg total) by mouth every 6 (six) hours as needed for nausea  or vomiting. 30 tablet 2    scopolamine (TRANSDERM-SCOP) 1 MG/3DAYS Place 1 patch (1.5 mg total) onto the skin every 3 (three) days. 10 patch 1     Review of Systems  Gastrointestinal:  Positive for abdominal pain (Mild cramping 2-3/10). Negative for constipation, diarrhea, nausea and vomiting.  Genitourinary:  Positive for vaginal bleeding (Now small). Negative for difficulty urinating and dysuria.  Neurological:  Negative for dizziness, light-headedness and headaches.  Physical Exam   Blood pressure 136/82, pulse (!) 104, temperature 98.7 F (37.1 C), resp. rate 17, height 5\' 7"  (1.702 m), weight 63 kg, last menstrual period 04/07/2021, SpO2 100 %, not currently breastfeeding.  Physical Exam Vitals reviewed.  Constitutional:      General: She is not in acute distress.    Appearance: Normal appearance. She is not ill-appearing.  HENT:     Head: Normocephalic and atraumatic.  Eyes:     Conjunctiva/sclera: Conjunctivae normal.  Cardiovascular:     Rate and Rhythm: Normal rate.  Pulmonary:     Effort: Pulmonary  effort is normal. No respiratory distress.  Musculoskeletal:        General: Normal range of motion.     Cervical back: Normal range of motion.  Neurological:     Mental Status: She is alert and oriented to person, place, and time.  Psychiatric:        Mood and Affect: Mood normal.        Behavior: Behavior normal.    MAU Course  Procedures Results for orders placed or performed during the hospital encounter of 06/14/21 (from the past 24 hour(s))  Type and screen [place]     Status: None   Collection Time: 06/14/21 10:00 PM  Result Value Ref Range   ABO/RH(D) O POS    Antibody Screen NEG    Sample Expiration      06/17/2021,2359 Performed at [facility], [place] [address], [place], [place] [address]   Comprehensive metabolic panel     Status: Abnormal   Collection Time: 06/14/21 10:15 PM  Result Value Ref Range   Sodium 139 135 - 145 mmol/L   Potassium 3.5 3.5 - 5.1 mmol/L   Chloride 107 98 - 111 mmol/L   CO2 22 22 - 32 mmol/L   Glucose, Bld 96 70 - 99 mg/dL   BUN 16 6 - 20 mg/dL   Creatinine, Ser 0.73 0.44 - 1.00 mg/dL   Calcium 9.1 8.9 - 10.3 mg/dL   Total Protein 6.6 6.5 - 8.1 g/dL   Albumin 3.7 3.5 - 5.0 g/dL   AST 12 (L) 15 - 41 U/L   ALT 11 0 - 44 U/L   Alkaline Phosphatase 47 38 - 126 U/L   Total Bilirubin 0.3 0.3 - 1.2 mg/dL   GFR, Estimated >60 >60 mL/min   Anion gap 10 5 - 15  CBC with Differential     Status: Abnormal   Collection Time: 06/14/21 10:15 PM  Result Value Ref Range   WBC 11.1 (H) 4.0 - 10.5 K/uL   RBC 3.54 (L) 3.87 - 5.11 MIL/uL   Hemoglobin 10.8 (L) 12.0 - 15.0 g/dL   HCT 32.6 (L) 36.0 - 46.0 %   MCV 92.1 80.0 - 100.0 fL   MCH 30.5 26.0 - 34.0 pg   MCHC 33.1 30.0 - 36.0 g/dL   RDW 13.6 11.5 - 15.5 %   Platelets 280 150 - 400 K/uL   nRBC 0.0 0.0 - 0.2 %  Neutrophils Relative % 79 %   Neutro Abs 8.8 (H) 1.7 - 7.7 K/uL   Lymphocytes Relative 17 %   Lymphs Abs 1.8 0.7 - 4.0 K/uL   Monocytes Relative 3 %    Monocytes Absolute 0.4 0.1 - 1.0 K/uL   Eosinophils Relative 0 %   Eosinophils Absolute 0.0 0.0 - 0.5 K/uL   Basophils Relative 0 %   Basophils Absolute 0.0 0.0 - 0.1 K/uL   Immature Granulocytes 1 %   Abs Immature Granulocytes 0.06 0.00 - 0.07 K/uL  Protime-INR     Status: None   Collection Time: 06/14/21 10:15 PM  Result Value Ref Range   Prothrombin Time 14.9 11.4 - 15.2 seconds   INR 1.2 0.8 - 1.2  hCG, quantitative, pregnancy     Status: Abnormal   Collection Time: 06/14/21 10:15 PM  Result Value Ref Range   hCG, Beta Chain, Quant, S 3,474 (H) <5 mIU/mL  I-Stat beta hCG blood, ED     Status: Abnormal   Collection Time: 06/14/21 10:29 PM  Result Value Ref Range   I-stat hCG, quantitative >2,000.0 (H) <5 mIU/mL   Comment 3           US OB Transvaginal  Result Date: 06/15/2021 CLINICAL DATA:  Vaginal bleeding following abortion pill. EXAM: TRANSVAGINAL OB ULTRASOUND TECHNIQUE: Transvaginal ultrasound was performed for complete evaluation of the gestation as well as the maternal uterus, adnexal regions, and pelvic cul-de-sac. COMPARISON:  Obstetrical ultrasound 05/25/2021 FINDINGS: Intrauterine gestational sac: None Endometrium: Heterogeneous and thickened measuring up to 2.7 cm. There is some increased vascular flow seen within the endometrium. Maternal uterus/adnexae: The bilateral ovaries appear within normal limits. No adnexal mass. No free fluid. IMPRESSION: 1. No intrauterine gestational sac identified. Thickened heterogeneous endometrium containing vascularity worrisome for retained products of conception. Electronically Signed   By: Ronney Asters M.D.   On: 06/15/2021 00:17    MDM Lab Add-On Ultrasound Consult Follow Up Assessment and Plan  26 year old G2P1001 S/P TAB  -POC Reviewed. -Informed that will send for [place] to assess uterine contents. -Patient offered and declines pain medication. -hCG lab added to previous [place] labs. -Await results.   Maryann Conners 06/14/2021, 11:17 PM   Reassessment (12:22 AM)  -[place] returns as above. -Dr. Little Ishikawa consulted and informed of patient status, evaluation, interventions, and results. Advised: *Hold cytotec dosing. *Have patient f/u in one week -Message sent to [facility] to schedule accordingly -Patient informed of results. -Bleeding precautions given. -Encouraged to call primary office or return to MAU if symptoms worsen or with the onset of new symptoms. -Discharged to home in stable condition.  Maryann Conners MSN, CNM Advanced Practice Provider, [facility]

## 2021-06-14 NOTE — ED Provider Notes (Signed)
22:43: CONSULT: Discussed with [facility] Mackenzie Allen- accepts patient in transfer to MAU.    Mackenzie Allen 06/14/21 2243    Alvira Monday, MD 06/15/21 [phone]

## 2021-06-14 NOTE — MAU Note (Signed)
Pt took first pill for TAB on 5/18 and took second pills on 5/19. Bleeding started on 5/19 but heavy bleeding and clots started today. Reports about 4 large clots today. Having mild cramping intermittently. Has not taken anything for pain.

## 2021-06-15 DIAGNOSIS — Z9889 Other specified postprocedural states: Secondary | ICD-10-CM

## 2021-06-15 NOTE — Progress Notes (Signed)
Gerrit Heck CNM in earlier to discuss u/s results and d/c plan. Written and verbal d/c instructions given and understanding voiced.

## 2021-06-21 ENCOUNTER — Other Ambulatory Visit: Payer: Medicaid Other

## 2021-06-21 DIAGNOSIS — Z9889 Other specified postprocedural states: Secondary | ICD-10-CM

## 2021-06-22 LAB — BETA HCG QUANT (REF LAB): hCG Quant: 871 m[IU]/mL

## 2021-06-28 ENCOUNTER — Ambulatory Visit (INDEPENDENT_AMBULATORY_CARE_PROVIDER_SITE_OTHER): Payer: Medicaid Other | Admitting: Obstetrics

## 2021-06-28 ENCOUNTER — Encounter: Payer: Self-pay | Admitting: Obstetrics

## 2021-06-28 ENCOUNTER — Ambulatory Visit: Payer: Medicaid Other | Admitting: Obstetrics

## 2021-06-28 ENCOUNTER — Ambulatory Visit (INDEPENDENT_AMBULATORY_CARE_PROVIDER_SITE_OTHER): Payer: Medicaid Other | Admitting: Licensed Clinical Social Worker

## 2021-06-28 VITALS — BP 128/88 | HR 94 | Ht 63.0 in | Wt 143.0 lb

## 2021-06-28 DIAGNOSIS — Z9889 Other specified postprocedural states: Secondary | ICD-10-CM | POA: Diagnosis not present

## 2021-06-28 DIAGNOSIS — O074 Failed attempted termination of pregnancy without complication: Secondary | ICD-10-CM | POA: Diagnosis not present

## 2021-06-28 DIAGNOSIS — F4321 Adjustment disorder with depressed mood: Secondary | ICD-10-CM

## 2021-06-28 LAB — POCT URINE PREGNANCY: Preg Test, Ur: POSITIVE — AB

## 2021-06-28 NOTE — Progress Notes (Unsigned)
Follow up for TAB on 5/18 and heavy bleeding on 5/30. Pt reports continued bleeding with clots. Denies abdominal cramping, dizziness, lightheadedness, fever, signs of infection.   U/S- on 5/30 shows thickened endometrium worrisome for retained products of conception.  + UPT today, Reports no intercourse since before TAB.  Wants to start Depo today.

## 2021-06-28 NOTE — Progress Notes (Unsigned)
Patient ID: Mackenzie Allen, female   DOB: 04-10-1995, 26 y.o.   MRN: [id number]  Chief Complaint  Patient presents with   Follow-up    HPI Mackenzie Allen is a 26 y.o. female.  Presents for follow up after a therapeutic abortion ~ 1 month ago, and was seen in MAU on 06/14/21 with vaginal bleeding, and ultrasound revealed a thickened endometrial stripe worrisome for retained POC.  Quant. Beta HCG was 3474.  The quant. Beta HCG was repeated on 06-21-21, and was 871.  She presents today for follow up and c/o scant vaginal bleeding, and no cramping, or fever.         HPI  Past Medical History:  Diagnosis Date   Herpes genitalia    Hypertension    Medical history non-contributory     Past Surgical History:  Procedure Laterality Date   WRIST SURGERY      Family History  Problem Relation Age of Onset   Asthma Mother    Varicose Veins Mother    Asthma Brother    Asthma Maternal Grandmother    Diabetes Maternal Grandmother    Heart disease Maternal Grandmother    Hypertension Maternal Grandmother    Arthritis Paternal Grandmother     Social History Social History   Tobacco Use   Smoking status: Former    Types: Cigars   Smokeless tobacco: Never   Tobacco comments:    1 Cigar a day   Vaping Use   Vaping Use: Never used  Substance Use Topics   Alcohol use: Not Currently   Drug use: Not Currently    Types: Marijuana    Comment: not since becoming pregnant    No Known Allergies  Current Outpatient Medications  Medication Sig Dispense Refill   acetaminophen (TYLENOL) 325 MG tablet Take 2 tablets (650 mg total) by mouth every 4 (four) hours as needed (for pain scale < 4). (Patient not taking: Reported on 10/05/2020)     No current facility-administered medications for this visit.    Review of Systems Review of Systems Constitutional: negative for fatigue and weight loss Respiratory: negative for cough and wheezing Cardiovascular: negative  for chest pain, fatigue and palpitations Gastrointestinal: negative for abdominal pain and change in bowel habits Genitourinary:positive for vaginal bleeding Integument/breast: negative for nipple discharge Musculoskeletal:negative for myalgias Neurological: negative for gait problems and tremors Behavioral/Psych: negative for abusive relationship, depression Endocrine: negative for temperature intolerance      Blood pressure 128/88, pulse 94, height 5\' 3"  (1.6 m), weight 143 lb (64.9 kg), last menstrual period 04/07/2021, not currently breastfeeding.  Physical Exam Physical Exam General:   Alert and no distress  Skin:   no rash or abnormalities  Lungs:   clear to auscultation bilaterally  Heart:   regular rate and rhythm, S1, S2 normal, no murmur, click, rub or gallop  Breasts:   normal without suspicious masses, skin or nipple changes or axillary nodes  Abdomen:  normal findings: no organomegaly, soft, non-tender and no hernia  Pelvis:  External genitalia: normal general appearance Urinary system: urethral meatus normal and bladder without fullness, nontender Vaginal: normal without tenderness, induration or masses Cervix: normal appearance Adnexa: normal bimanual exam Uterus: anteverted and non-tender, normal size    I have spent a total of 20 minutes of face-to-face time, excluding clinical staff time, reviewing notes and preparing to see patient, ordering tests and/or medications, and counseling the patient.   Data Reviewed Ultrasound Labs  Assessment     1.  Status post therapeutic abortion Rx: - POCT urine pregnancy:  POSITIVE - Beta hCG quant (ref lab)  2. Incomplete therapeutic abortion Rx: - US PELVIC COMPLETE WITH TRANSVAGINAL; Future     Plan   Follow up in 2 days   Orders Placed This Encounter  Procedures   US PELVIC COMPLETE WITH TRANSVAGINAL    Standing Status:   Future    Standing Expiration Date:   06/29/2022    Order Specific Question:   Reason for  Exam (SYMPTOM  OR DIAGNOSIS REQUIRED)    Answer:   History of therapeutic abortion ~ 1 month ago.  U/S on 5-30 showed thickened endometrium.  Pregnancy test still positive. with continued vaginal bleeding.    Order Specific Question:   Preferred imaging location?    Answer:   [facility]   Beta hCG quant (ref lab)   POCT urine pregnancy      Shelly Bombard, MD 06/28/2021 3:22 PM

## 2021-06-29 LAB — BETA HCG QUANT (REF LAB): hCG Quant: 186 m[IU]/mL

## 2021-06-29 NOTE — BH Specialist Note (Signed)
Integrated Behavioral Health Initial In-Person Visit  MRN: [id number] Name: Mackenzie Allen  Number of Integrated Behavioral Health Clinician visits: 1 Session Start time:   3:00pm Session End time: 3:31pm Total time in minutes: 31 mins in person at [facility]   Types of Service: Individual psychotherapy  Interpretor:No. Interpretor Name and Language: none    Warm Hand Off Completed.        Subjective: Mackenzie Allen is a 26 y.o. female accompanied by n/a Patient was referred by Mackenzie Allen for depressive symptoms \. 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  Self-Care: n/a Life Changes: pregnancy loss   Patient and/or Family's Strengths/Protective Factors: Concrete supports in place (healthy food, safe environments, etc.)  Goals Addressed: Patient will: Reduce symptoms of: depression Increase knowledge and/or ability of: coping skills  Demonstrate ability to: Begin healthy grieving over loss  Progress towards Goals: Ongoing  Interventions: Interventions utilized: Supportive Counseling  Standardized Assessments completed: Not Needed  Patient and/or Family Response: Mackenzie Allen responded well to visit   Assessment: Patient currently experiencing grief associated with pregnancy loss .   Patient may benefit from integrated behavioral health.  Plan: Follow up with behavioral health clinician on : as needed  Behavioral recommendations: prioritize rest, engage with pregnancy loss support group Referral(s): Integrated [employer] (In Clinic) "From scale of 1-10, how likely are you to follow plan?":     Gwyndolyn Saxon, LCSW

## 2021-07-06 ENCOUNTER — Ambulatory Visit: Admission: RE | Admit: 2021-07-06 | Payer: Medicaid Other | Source: Ambulatory Visit

## 2022-01-03 IMAGING — US US OB COMP LESS 14 WK
1 series · 15 of 28 positions shown · non-contrast
Comparison: None.

CLINICAL DATA: Vaginal bleed.  Pregnant.

EXAM:
OBSTETRIC <14 WK ULTRASOUND
TECHNIQUE: Transabdominal ultrasound was performed for evaluation of the
gestation as well as the maternal uterus and adnexal regions.

[Series 1: us ob comp less 14 wk · 15 of 85 slices shown]
[im 1/85]
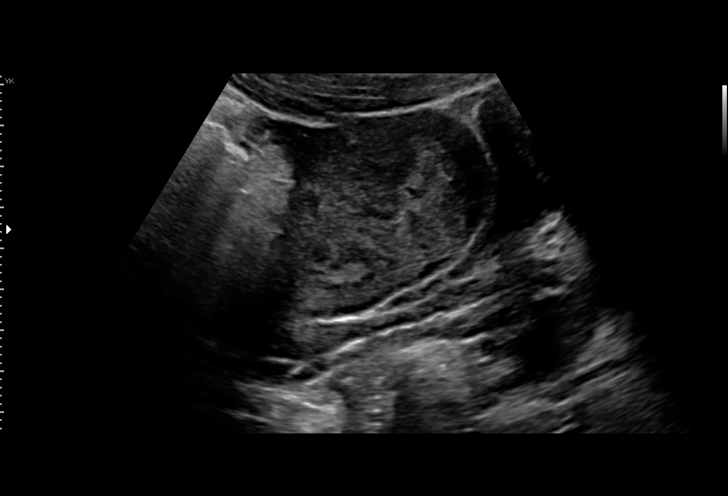
[im 7/85]
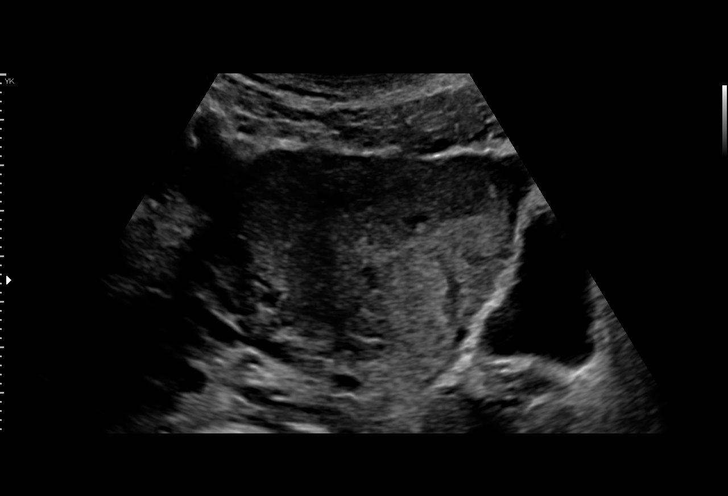
[im 13/85]
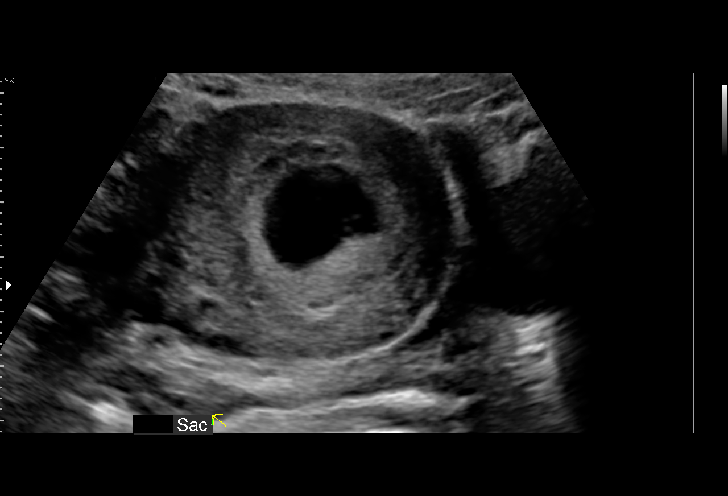
[im 19/85]
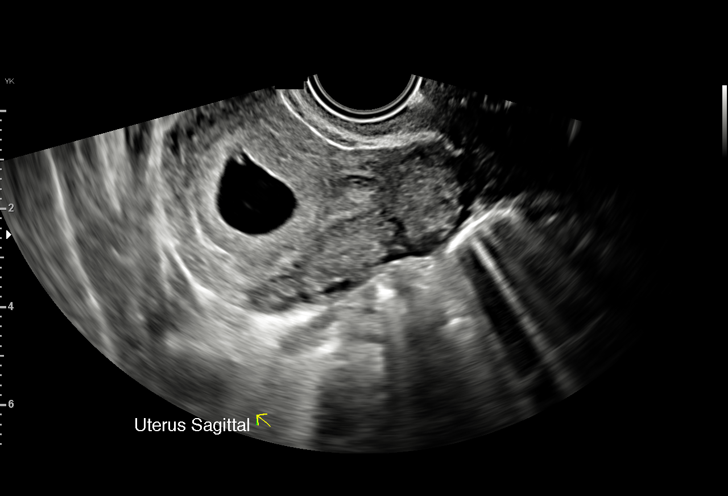
[im 25/85]
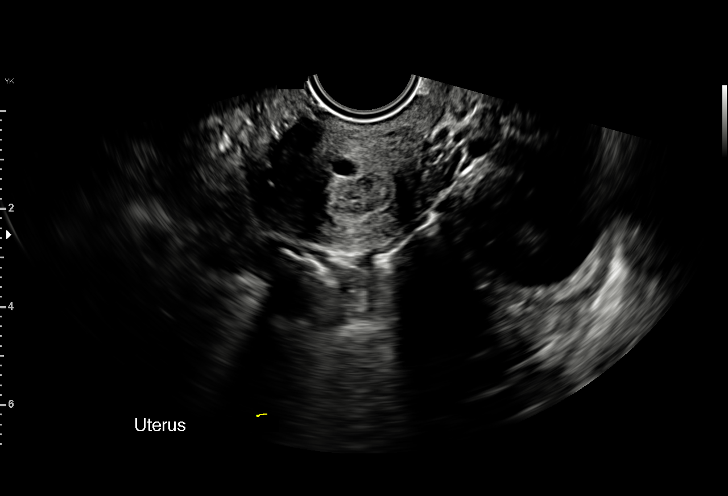
[im 32/85]
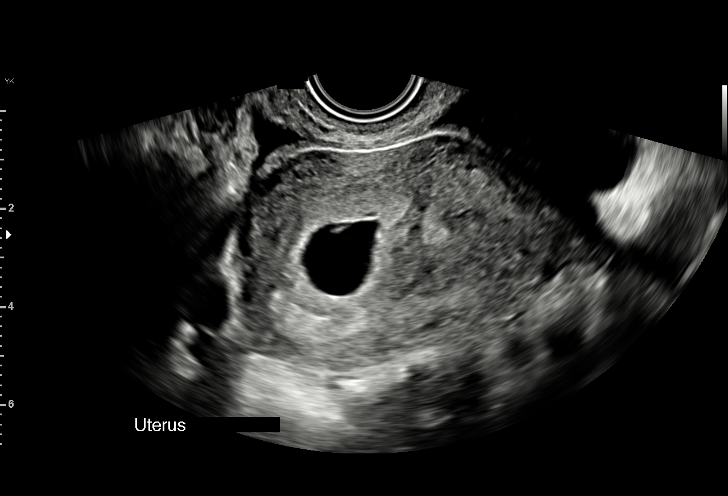
[im 38/85]
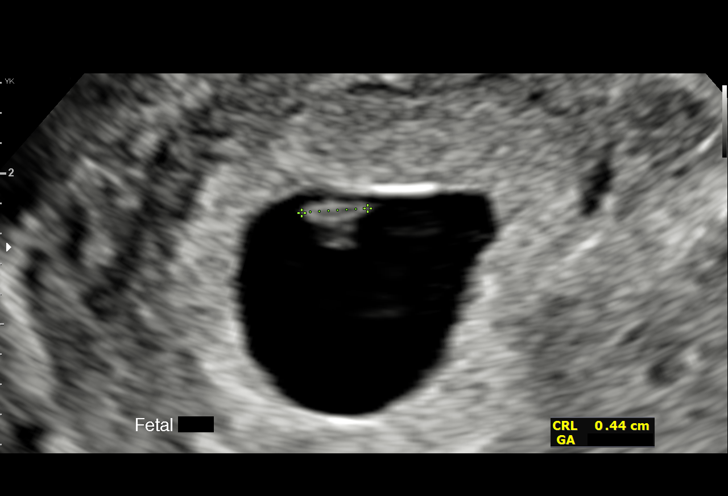
[im 44/85]
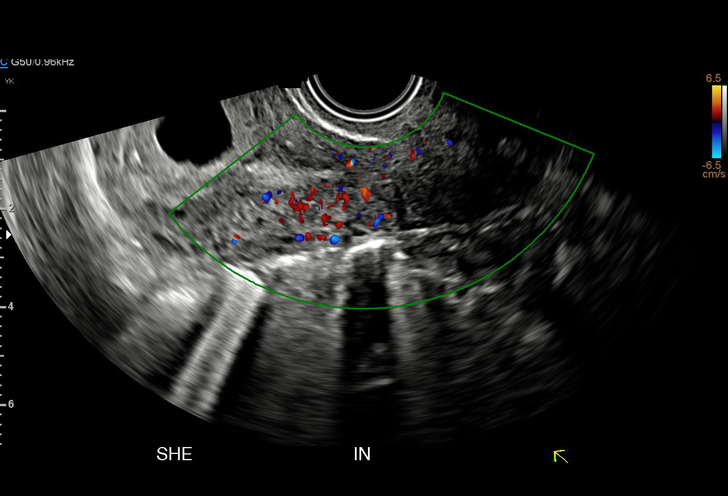
[im 47/85]
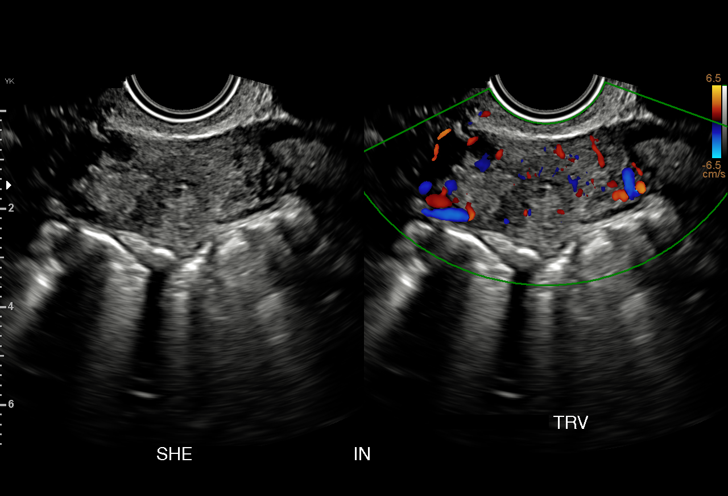
[im 53/85]
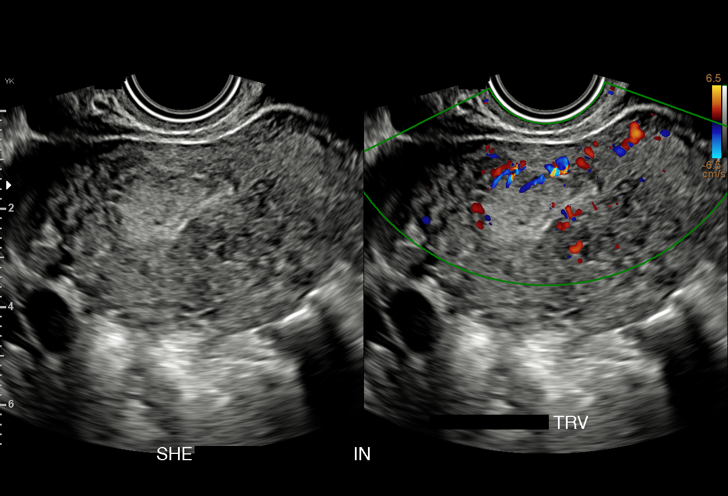
[im 60/85]
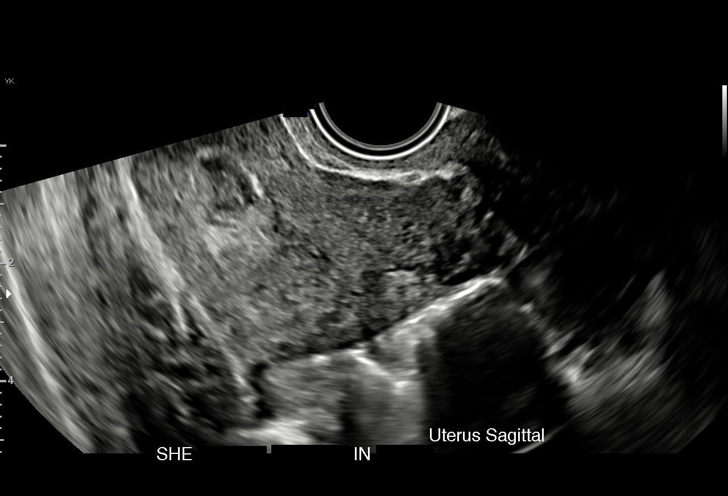
[im 66/85]
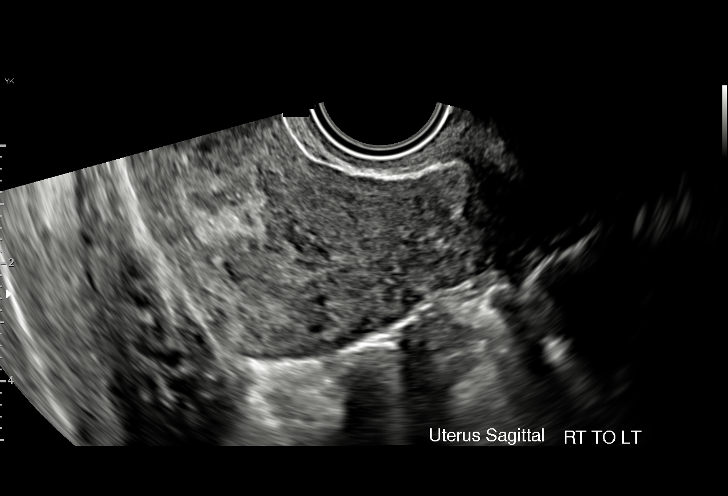
[im 72/85]
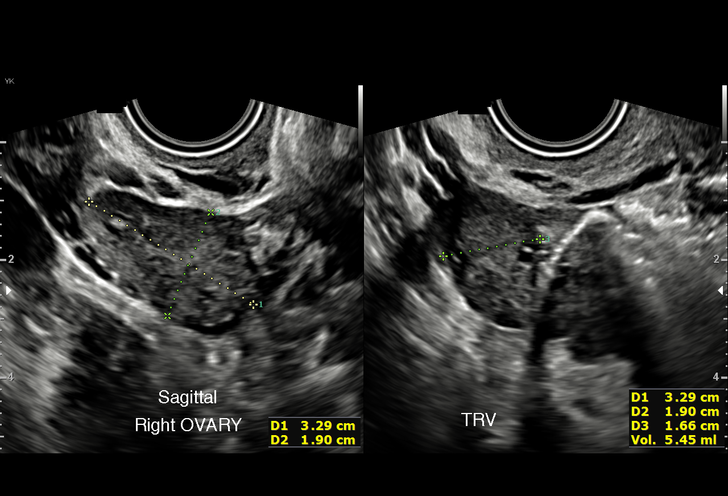
[im 78/85]
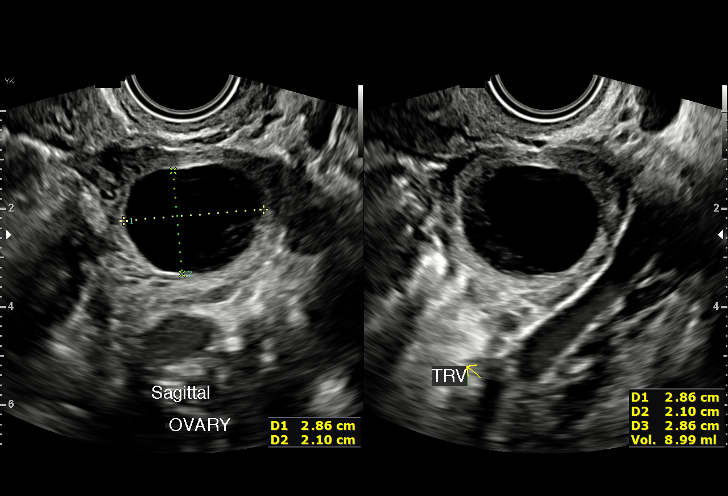
[im 85/85]
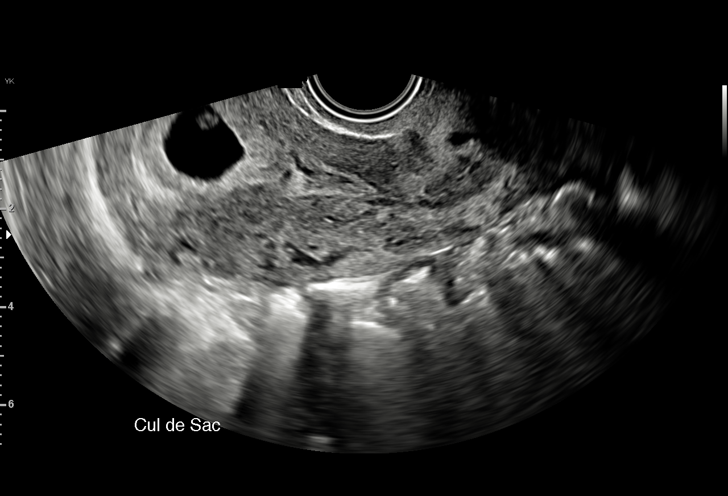

[15 of 28 positions shown; findings below may reference images not displayed]

FINDINGS: Intrauterine gestational sac: Single

Yolk sac:  Visualized.

Embryo:  Visualized.

Cardiac Activity: Visualized.

Heart Rate: 113 bpm

MSD:    mm    w     d

CRL:   5.1 mm   6 w 1 d                  US EDC: September 11, 2020

Subchorionic hemorrhage:  Small

Maternal uterus/adnexae: Corpus luteal cyst is identified in the
left ovary measuring 2.9 x 2.9 x 2.1 cm.

IUD is not definitely identified in the uterus.
IMPRESSION: Single live intrauterine pregnancy measuring to 6 weeks and 1 day.
Small subchorionic hemorrhage is noted. IUD is not definitely
identified in the uterus.

## 2022-06-01 ENCOUNTER — Other Ambulatory Visit: Payer: Self-pay

## 2022-06-01 ENCOUNTER — Inpatient Hospital Stay (HOSPITAL_COMMUNITY): Payer: Medicaid Other

## 2022-06-01 ENCOUNTER — Encounter (HOSPITAL_COMMUNITY): Payer: Self-pay | Admitting: *Deleted

## 2022-06-01 ENCOUNTER — Inpatient Hospital Stay (HOSPITAL_COMMUNITY)
Admission: AD | Admit: 2022-06-01 | Discharge: 2022-06-01 | Disposition: A | Discharge status: Home / Self Care | Payer: Medicaid Other | Attending: Obstetrics and Gynecology | Admitting: Obstetrics and Gynecology

## 2022-06-01 DIAGNOSIS — O98311 Other infections with a predominantly sexual mode of transmission complicating pregnancy, first trimester: Secondary | ICD-10-CM | POA: Diagnosis not present

## 2022-06-01 DIAGNOSIS — A599 Trichomoniasis, unspecified: Secondary | ICD-10-CM | POA: Diagnosis not present

## 2022-06-01 DIAGNOSIS — Z3A01 Less than 8 weeks gestation of pregnancy: Secondary | ICD-10-CM | POA: Insufficient documentation

## 2022-06-01 DIAGNOSIS — Z3491 Encounter for supervision of normal pregnancy, unspecified, first trimester: Secondary | ICD-10-CM

## 2022-06-01 DIAGNOSIS — A5901 Trichomonal vulvovaginitis: Secondary | ICD-10-CM | POA: Insufficient documentation

## 2022-06-01 DIAGNOSIS — O208 Other hemorrhage in early pregnancy: Secondary | ICD-10-CM | POA: Insufficient documentation

## 2022-06-01 LAB — WET PREP, GENITAL
Clue Cells Wet Prep HPF POC: NONE SEEN
Sperm: NONE SEEN
WBC, Wet Prep HPF POC: >=10 — AB (ref <10)
Yeast Wet Prep HPF POC: NONE SEEN

## 2022-06-01 LAB — URINALYSIS, ROUTINE W REFLEX MICROSCOPIC
Bilirubin Urine: NEGATIVE
Glucose, UA: NEGATIVE mg/dL
Ketones, ur: NEGATIVE mg/dL
Nitrite: NEGATIVE
Protein, ur: NEGATIVE mg/dL
Specific Gravity, Urine: 1.023 (ref 1.005–1.030)
pH: 6 (ref 5.0–8.0)

## 2022-06-01 LAB — CBC
HCT: 35.3 % — ABNORMAL LOW (ref 36.0–46.0)
Hemoglobin: 11.6 g/dL — ABNORMAL LOW (ref 12.0–15.0)
MCH: 29.7 pg (ref 26.0–34.0)
MCHC: 32.9 g/dL (ref 30.0–36.0)
MCV: 90.3 fL (ref 80.0–100.0)
Platelets: 244 10*3/uL (ref 150–400)
RBC: 3.91 MIL/uL (ref 3.87–5.11)
RDW: 15.4 % (ref 11.5–15.5)
WBC: 6.4 10*3/uL (ref 4.0–10.5)
nRBC: 0 % (ref 0.0–0.2)

## 2022-06-01 LAB — HCG, QUANTITATIVE, PREGNANCY: hCG, Beta Chain, Quant, S: 17999 m[IU]/mL — ABNORMAL HIGH (ref <5)

## 2022-06-01 LAB — POCT PREGNANCY, URINE: Preg Test, Ur: POSITIVE — AB

## 2022-06-01 MED ORDER — METRONIDAZOLE 500 MG PO TABS: 500.0000 mg | ORAL_TABLET | Freq: Two times a day (BID) | ORAL | 0 refills | Status: DC

## 2022-06-01 NOTE — MAU Note (Signed)
Mackenzie Allen is a 27 y.o. at Unknown here in MAU reporting: she's had 2 positive HPT and began spotting with wiping yesterday @ 1900.  Denies pain/cramping. LMP: 04/23/2022 Onset of complaint: yesterday Pain score: 0 Vitals:   06/01/22 0956  BP: 120/78  Pulse: 79  Resp: 18  Temp: 98.2 F (36.8 C)  SpO2: 100%     FHT:NA Lab orders placed from triage:   UPT & UA

## 2022-06-01 NOTE — MAU Provider Note (Signed)
History     CSN: [id number]  Arrival date and time: 06/01/22 4098   Event Date/Time   First Provider Initiated Contact with Patient 06/01/22 1057      Chief Complaint  Patient presents with   Spotting   HPI  Mackenzie Allen is a 27 y.o. G3P1001 at [redacted]w[redacted]d who presents for evaluation of spotting. Patient reports she had a positive home pregnancy test yesterday and then started having spotting with wiping at 1900. She denies any pain. She denies any discharge. Denies any constipation, diarrhea or any urinary complaints. She has not been seen anywhere this pregnancy. She reports a LMP of 04/23/22.   OB History     Gravida  3   Para  1   Term  1   Preterm      AB      Living  1      SAB      IAB      Ectopic      Multiple  0   Live Births  1           Past Medical History:  Diagnosis Date   Herpes genitalia    Hypertension    Medical history non-contributory     Past Surgical History:  Procedure Laterality Date   WRIST SURGERY      Family History  Problem Relation Age of Onset   Asthma Mother    Varicose Veins Mother    Asthma Brother    Asthma Maternal Grandmother    Diabetes Maternal Grandmother    Heart disease Maternal Grandmother    Hypertension Maternal Grandmother    Arthritis Paternal Grandmother     Social History   Tobacco Use   Smoking status: Former    Types: Cigars   Smokeless tobacco: Never   Tobacco comments:    1 Cigar a day   Vaping Use   Vaping Use: Never used  Substance Use Topics   Alcohol use: Not Currently   Drug use: Not Currently    Types: Marijuana    Comment: not since becoming pregnant    Allergies: No Known Allergies  No medications prior to admission.    Review of Systems  Constitutional: Negative.  Negative for fatigue and fever.  HENT: Negative.    Respiratory: Negative.  Negative for shortness of breath.   Cardiovascular: Negative.  Negative for chest pain.  Gastrointestinal:  Negative.  Negative for abdominal pain, constipation, diarrhea, nausea and vomiting.  Genitourinary:  Positive for vaginal bleeding. Negative for dysuria and vaginal discharge.  Neurological: Negative.  Negative for dizziness and headaches.   Physical Exam   Blood pressure 120/78, pulse 79, temperature 98.2 F (36.8 C), temperature source Oral, resp. rate 18, height 5\' 7"  (1.702 m), weight 57.9 kg, last menstrual period 04/23/2022, SpO2 100 %, not currently breastfeeding.  Patient Vitals for the past 24 hrs:  BP Temp Temp src Pulse Resp SpO2 Height Weight  06/01/22 0956 120/78 98.2 F (36.8 C) Oral 79 18 100 % -- --  06/01/22 0952 -- -- -- -- -- -- 5\' 7"  (1.702 m) 57.9 kg    Physical Exam Vitals and nursing note reviewed.  Constitutional:      General: She is not in acute distress.    Appearance: She is well-developed.  HENT:     Head: Normocephalic.  Eyes:     Pupils: Pupils are equal, round, and reactive to light.  Cardiovascular:     Rate and Rhythm: Normal rate  and regular rhythm.     Heart sounds: Normal heart sounds.  Pulmonary:     Effort: Pulmonary effort is normal. No respiratory distress.     Breath sounds: Normal breath sounds.  Abdominal:     General: Bowel sounds are normal. There is no distension.     Palpations: Abdomen is soft.     Tenderness: There is no abdominal tenderness.  Skin:    General: Skin is warm and dry.  Neurological:     Mental Status: She is alert and oriented to person, place, and time.  Psychiatric:        Mood and Affect: Mood normal.        Behavior: Behavior normal.        Thought Content: Thought content normal.        Judgment: Judgment normal.    MAU Course  Procedures  Results for orders placed or performed during the hospital encounter of 06/01/22 (from the past 24 hour(s))  Pregnancy, urine POC     Status: Abnormal   Collection Time: 06/01/22  9:52 AM  Result Value Ref Range   Preg Test, Ur POSITIVE (A) NEGATIVE   Urinalysis, Routine w reflex microscopic -Urine, Clean Catch     Status: Abnormal   Collection Time: 06/01/22 10:00 AM  Result Value Ref Range   Color, Urine YELLOW YELLOW   APPearance HAZY (A) CLEAR   Specific Gravity, Urine 1.023 1.005 - 1.030   pH 6.0 5.0 - 8.0   Glucose, UA NEGATIVE NEGATIVE mg/dL   Hgb urine dipstick MODERATE (A) NEGATIVE   Bilirubin Urine NEGATIVE NEGATIVE   Ketones, ur NEGATIVE NEGATIVE mg/dL   Protein, ur NEGATIVE NEGATIVE mg/dL   Nitrite NEGATIVE NEGATIVE   Leukocytes,Ua TRACE (A) NEGATIVE   RBC / HPF 0-5 0 - 5 RBC/hpf   WBC, UA 0-5 0 - 5 WBC/hpf   Bacteria, UA RARE (A) NONE SEEN   Squamous Epithelial / HPF 11-20 0 - 5 /HPF   Mucus PRESENT   CBC     Status: Abnormal   Collection Time: 06/01/22 10:09 AM  Result Value Ref Range   WBC 6.4 4.0 - 10.5 K/uL   RBC 3.91 3.87 - 5.11 MIL/uL   Hemoglobin 11.6 (L) 12.0 - 15.0 g/dL   HCT 95.6 (L) 38.7 - 56.4 %   MCV 90.3 80.0 - 100.0 fL   MCH 29.7 26.0 - 34.0 pg   MCHC 32.9 30.0 - 36.0 g/dL   RDW 33.2 95.1 - 88.4 %   Platelets 244 150 - 400 K/uL   nRBC 0.0 0.0 - 0.2 %  hCG, quantitative, pregnancy     Status: Abnormal   Collection Time: 06/01/22 10:09 AM  Result Value Ref Range   hCG, Beta Chain, Quant, S 17,999 (H) <5 mIU/mL  Wet prep, genital     Status: Abnormal   Collection Time: 06/01/22 10:11 AM  Result Value Ref Range   Yeast Wet Prep HPF POC NONE SEEN NONE SEEN   Trich, Wet Prep PRESENT (A) NONE SEEN   Clue Cells Wet Prep HPF POC NONE SEEN NONE SEEN   WBC, Wet Prep HPF POC >=10 (A) <10   Sperm NONE SEEN      US OB LESS THAN 14 WEEKS WITH OB TRANSVAGINAL  Result Date: 06/01/2022 CLINICAL DATA:  Vaginal bleeding for 1 day. EXAM: OBSTETRIC <14 WK [place] AND TRANSVAGINAL OB US TECHNIQUE: Both transabdominal and transvaginal ultrasound examinations were performed for complete evaluation of the gestation as well  as the maternal uterus, adnexal regions, and pelvic cul-de-sac. Transvaginal technique was  performed to assess early pregnancy. COMPARISON:  None Available. FINDINGS: Intrauterine gestational sac: Single Yolk sac:  Visualized. Embryo:  Visualized. Cardiac Activity: Not Visualized. MSD: 11.0 mm   5 w   6 d CRL:  1.4  mm Subchorionic hemorrhage: Small subchorionic hemorrhage measuring 0.6 x 0.3 x 0.2 cm. Maternal uterus/adnexae: Normal IMPRESSION: 1. Single intrauterine gestation with yolk sac and a small fetal pole. Mean sac diameter of 11 mm corresponding to gestational age of [redacted] weeks and 6 days. No embryonic cardiac activity detected likely secondary to early gestation. 2. Small subchorionic hemorrhage measuring  0.6 x 0.3 x 0.2 cm. Electronically Signed   By: Larose Hires D.O.   On: 06/01/2022 10:53     MDM Labs ordered and reviewed.   UA, UPT CBC, HCG ABO/Rh- O Pos Wet prep and gc/chlamydia US OB Comp Less 14 weeks with Transvaginal  CNM independently reviewed the imaging ordered. Imaging show IUP consistent with dates with no FHR activity yet.   CNM reviewed recommendation for repeat ultrasound in 10-14 days. Patient agreeable and appointment made 6/4.  Reviewed positive trich and treatment including importance of partner treatment.   Assessment and Plan   1. Normal intrauterine pregnancy on prenatal ultrasound in first trimester   2. [redacted] weeks gestation of pregnancy   3. Trichomoniasis     -Discharge home in stable condition -Rx for metronidazole sent to pharmacy -First trimester precautions discussed -Patient advised to follow-up with OB as scheduled for repeat ultrasound -Patient may return to MAU as needed or if her condition were to change or worsen  Rolm Bookbinder, CNM 06/01/2022, 10:57 AM

## 2022-06-01 NOTE — Discharge Instructions (Signed)

## 2022-06-02 LAB — GC/CHLAMYDIA PROBE AMP (~~LOC~~) NOT AT ARMC
Chlamydia: NEGATIVE
Comment: NEGATIVE
Comment: NORMAL
Neisseria Gonorrhea: NEGATIVE

## 2022-06-16 ENCOUNTER — Inpatient Hospital Stay (HOSPITAL_COMMUNITY)
Admission: AD | Admit: 2022-06-16 | Discharge: 2022-06-16 | Disposition: A | Discharge status: Home / Self Care | Payer: Medicaid Other | Attending: Obstetrics and Gynecology | Admitting: Obstetrics and Gynecology

## 2022-06-16 ENCOUNTER — Encounter (HOSPITAL_COMMUNITY): Payer: Self-pay | Admitting: Obstetrics and Gynecology

## 2022-06-16 ENCOUNTER — Other Ambulatory Visit: Payer: Self-pay

## 2022-06-16 DIAGNOSIS — O161 Unspecified maternal hypertension, first trimester: Secondary | ICD-10-CM | POA: Insufficient documentation

## 2022-06-16 DIAGNOSIS — O219 Vomiting of pregnancy, unspecified: Secondary | ICD-10-CM | POA: Insufficient documentation

## 2022-06-16 DIAGNOSIS — Z3A01 Less than 8 weeks gestation of pregnancy: Secondary | ICD-10-CM | POA: Diagnosis not present

## 2022-06-16 DIAGNOSIS — O99321 Drug use complicating pregnancy, first trimester: Secondary | ICD-10-CM | POA: Insufficient documentation

## 2022-06-16 LAB — URINALYSIS, ROUTINE W REFLEX MICROSCOPIC
Bilirubin Urine: NEGATIVE
Glucose, UA: NEGATIVE mg/dL
Hgb urine dipstick: NEGATIVE
Ketones, ur: 80 mg/dL — AB
Nitrite: NEGATIVE
Protein, ur: 30 mg/dL — AB
Specific Gravity, Urine: 1.026 (ref 1.005–1.030)
pH: 6 (ref 5.0–8.0)

## 2022-06-16 MED ORDER — NIFEDIPINE ER OSMOTIC RELEASE 30 MG PO TB24: 30.0000 mg | ORAL_TABLET | Freq: Every day | ORAL | 11 refills | Status: DC

## 2022-06-16 MED ORDER — NIFEDIPINE ER OSMOTIC RELEASE 30 MG PO TB24
30.0000 mg | ORAL_TABLET | Freq: Once | ORAL | Status: AC
  Administered 2022-06-16: 30 mg via ORAL
  Filled 2022-06-16: qty 1

## 2022-06-16 MED ORDER — PROMETHAZINE HCL 25 MG PO TABS: 25.0000 mg | ORAL_TABLET | Freq: Four times a day (QID) | ORAL | 2 refills | Status: DC | PRN

## 2022-06-16 MED ORDER — ONDANSETRON 8 MG PO TBDP: 8.0000 mg | ORAL_TABLET | Freq: Three times a day (TID) | ORAL | 2 refills | Status: DC | PRN

## 2022-06-16 MED ORDER — LACTATED RINGERS IV BOLUS
1000.0000 mL | Freq: Once | INTRAVENOUS | Status: AC
  Administered 2022-06-16: 1000 mL via INTRAVENOUS

## 2022-06-16 MED ORDER — METOCLOPRAMIDE HCL 10 MG PO TABS: 10.0000 mg | ORAL_TABLET | Freq: Four times a day (QID) | ORAL | 2 refills | Status: DC

## 2022-06-16 MED ORDER — ONDANSETRON HCL 4 MG/2ML IJ SOLN
4.0000 mg | Freq: Once | INTRAMUSCULAR | Status: AC
  Administered 2022-06-16: 4 mg via INTRAVENOUS
  Filled 2022-06-16: qty 2

## 2022-06-16 MED ORDER — SCOPOLAMINE 1 MG/3DAYS TD PT72: 1.0000 | MEDICATED_PATCH | TRANSDERMAL | 12 refills | Status: DC

## 2022-06-16 MED ORDER — SCOPOLAMINE 1 MG/3DAYS TD PT72
1.0000 | MEDICATED_PATCH | TRANSDERMAL | Status: DC
  Administered 2022-06-16: 1.5 mg via TRANSDERMAL
  Filled 2022-06-16: qty 1

## 2022-06-16 MED ORDER — METOCLOPRAMIDE HCL 5 MG/ML IJ SOLN
10.0000 mg | Freq: Once | INTRAMUSCULAR | Status: AC
  Administered 2022-06-16: 10 mg via INTRAMUSCULAR
  Filled 2022-06-16: qty 2

## 2022-06-16 NOTE — Discharge Instructions (Signed)

## 2022-06-16 NOTE — MAU Provider Note (Signed)
History     CSN: [id number]  Arrival date and time: 06/16/22 1156   Event Date/Time   First Provider Initiated Contact with Patient 06/16/22 1230      Chief Complaint  Patient presents with   Nausea   Emesis   Heart Pounding   HPI  Mackenzie Allen is a 27 y.o. G3P1001 at [redacted]w[redacted]d who presents for evaluation of nausea and vomiting. Patient reports she is struggling to keep any food or liquid down. It all started over the last week. She does not have any antiemetics at home. She also reports having episodes where she feels like her heart is racing. She has a hx of hypertension but only takes medication during pregnancy. She denies any chest pain or shortness of breath. She denies any pain.  She denies any vaginal bleeding and discharge. Denies any constipation, diarrhea or any urinary complaints.    OB History     Gravida  3   Para  1   Term  1   Preterm      AB      Living  1      SAB      IAB      Ectopic      Multiple  0   Live Births  1           Past Medical History:  Diagnosis Date   Herpes genitalia    Hypertension    Medical history non-contributory     Past Surgical History:  Procedure Laterality Date   WRIST SURGERY      Family History  Problem Relation Age of Onset   Asthma Mother    Varicose Veins Mother    Asthma Brother    Asthma Maternal Grandmother    Diabetes Maternal Grandmother    Heart disease Maternal Grandmother    Hypertension Maternal Grandmother    Arthritis Paternal Grandmother     Social History   Tobacco Use   Smoking status: Former    Types: Cigars   Smokeless tobacco: Never   Tobacco comments:    1 Cigar a day   Vaping Use   Vaping Use: Never used  Substance Use Topics   Alcohol use: Not Currently   Drug use: Not Currently    Types: Marijuana    Comment: not since becoming pregnant    Allergies: No Known Allergies  No medications prior to admission.    Review of Systems   Constitutional: Negative.  Negative for fatigue and fever.  HENT: Negative.    Respiratory: Negative.  Negative for shortness of breath.   Cardiovascular: Negative.  Negative for chest pain.  Gastrointestinal:  Positive for nausea and vomiting. Negative for abdominal pain, constipation and diarrhea.  Genitourinary: Negative.  Negative for dysuria, vaginal bleeding and vaginal discharge.  Neurological: Negative.  Negative for dizziness and headaches.   Physical Exam   Blood pressure (!) 148/79, pulse (!) 52, temperature 98.5 F (36.9 C), temperature source Oral, resp. rate 20, height 5\' 7"  (1.702 m), weight 54.6 kg, last menstrual period 04/23/2022, SpO2 100 %, not currently breastfeeding.  Patient Vitals for the past 24 hrs:  BP Temp Temp src Pulse Resp SpO2 Height Weight  06/16/22 1518 (!) 148/79 98.5 F (36.9 C) Oral (!) 52 20 100 % -- --  06/16/22 1443 (!) 165/95 -- -- (!) 56 -- 100 % -- --  06/16/22 1220 (!) 148/83 -- -- (!) 54 -- 100 % -- --  06/16/22 1208 (!) 170/93  98.1 F (36.7 C) Oral 61 18 100 % -- --  06/16/22 1203 -- -- -- -- -- -- 5\' 7"  (1.702 m) 54.6 kg    Physical Exam Vitals and nursing note reviewed.  Constitutional:      General: She is not in acute distress.    Appearance: She is well-developed.  HENT:     Head: Normocephalic.  Eyes:     Pupils: Pupils are equal, round, and reactive to light.  Cardiovascular:     Rate and Rhythm: Normal rate and regular rhythm.     Heart sounds: Normal heart sounds.  Pulmonary:     Effort: Pulmonary effort is normal. No respiratory distress.     Breath sounds: Normal breath sounds.  Abdominal:     General: Bowel sounds are normal. There is no distension.     Palpations: Abdomen is soft.     Tenderness: There is no abdominal tenderness.  Skin:    General: Skin is warm and dry.  Neurological:     Mental Status: She is alert and oriented to person, place, and time.  Psychiatric:        Mood and Affect: Mood normal.         Behavior: Behavior normal.        Thought Content: Thought content normal.        Judgment: Judgment normal.    MAU Course  Procedures  Results for orders placed or performed during the hospital encounter of 06/16/22 (from the past 24 hour(s))  Urinalysis, Routine w reflex microscopic -Urine, Clean Catch     Status: Abnormal   Collection Time: 06/16/22 12:12 PM  Result Value Ref Range   Color, Urine YELLOW YELLOW   APPearance HAZY (A) CLEAR   Specific Gravity, Urine 1.026 1.005 - 1.030   pH 6.0 5.0 - 8.0   Glucose, UA NEGATIVE NEGATIVE mg/dL   Hgb urine dipstick NEGATIVE NEGATIVE   Bilirubin Urine NEGATIVE NEGATIVE   Ketones, ur 80 (A) NEGATIVE mg/dL   Protein, ur 30 (A) NEGATIVE mg/dL   Nitrite NEGATIVE NEGATIVE   Leukocytes,Ua MODERATE (A) NEGATIVE   RBC / HPF 0-5 0 - 5 RBC/hpf   WBC, UA 6-10 0 - 5 WBC/hpf   Bacteria, UA RARE (A) NONE SEEN   Squamous Epithelial / HPF 6-10 0 - 5 /HPF   Mucus PRESENT      MDM Labs ordered and reviewed.   UA, UC LR bolus Scop patch Zofran IV  Procardia 30XL  At discharge, patient requested more antiemetics but IV already removed. IM reglan given  Assessment and Plan   1. Nausea and vomiting during pregnancy   2. [redacted] weeks gestation of pregnancy     -Discharge home in stable condition -Rx for procardia and antiemetics sent to pharmacy -First trimester precautions discussed -Patient advised to follow-up with OB as scheduled on Tuesday for BP check -Patient may return to MAU as needed or if her condition were to change or worsen  Rolm Bookbinder, CNM 06/16/2022, 12:31 PM

## 2022-06-16 NOTE — MAU Note (Signed)
Mackenzie Allen is a 27 y.o. at [redacted]w[redacted]d here in MAU reporting: she's been sick all week, can't keep anything down and "heart feels like it's beating out of my chest".  Denies VB.  Reports doesn't have any meds prescribed for N&V. LMP: NA Onset of complaint: all week Pain score: 4 Vitals:   06/16/22 1208  BP: (!) 170/93  Pulse: 61  Resp: 18  Temp: 98.1 F (36.7 C)  SpO2: 100%     FHT:NA Lab orders placed from triage:   UA

## 2022-06-17 LAB — CULTURE, OB URINE

## 2022-06-18 ENCOUNTER — Encounter (HOSPITAL_COMMUNITY): Payer: Self-pay

## 2022-06-18 ENCOUNTER — Emergency Department (HOSPITAL_COMMUNITY): Payer: Medicaid Other

## 2022-06-18 ENCOUNTER — Emergency Department (HOSPITAL_COMMUNITY)
Admission: EM | Admit: 2022-06-18 | Discharge: 2022-06-18 | Disposition: A | Discharge status: Home / Self Care | Payer: Medicaid Other | Attending: Emergency Medicine | Admitting: Emergency Medicine

## 2022-06-18 ENCOUNTER — Other Ambulatory Visit: Payer: Self-pay

## 2022-06-18 ENCOUNTER — Inpatient Hospital Stay (EMERGENCY_DEPARTMENT_HOSPITAL)
Admission: AD | Admit: 2022-06-18 | Discharge: 2022-06-18 | Disposition: A | Discharge status: Home / Self Care | Payer: Medicaid Other | Source: Home / Self Care | Admitting: Family Medicine

## 2022-06-18 ENCOUNTER — Encounter (HOSPITAL_COMMUNITY): Payer: Self-pay | Admitting: Family Medicine

## 2022-06-18 DIAGNOSIS — R002 Palpitations: Secondary | ICD-10-CM | POA: Insufficient documentation

## 2022-06-18 DIAGNOSIS — O99411 Diseases of the circulatory system complicating pregnancy, first trimester: Secondary | ICD-10-CM | POA: Insufficient documentation

## 2022-06-18 DIAGNOSIS — O26891 Other specified pregnancy related conditions, first trimester: Secondary | ICD-10-CM | POA: Insufficient documentation

## 2022-06-18 DIAGNOSIS — F419 Anxiety disorder, unspecified: Secondary | ICD-10-CM

## 2022-06-18 DIAGNOSIS — Z3A08 8 weeks gestation of pregnancy: Secondary | ICD-10-CM | POA: Insufficient documentation

## 2022-06-18 DIAGNOSIS — R079 Chest pain, unspecified: Secondary | ICD-10-CM | POA: Insufficient documentation

## 2022-06-18 LAB — CBC
HCT: 37.8 % (ref 36.0–46.0)
Hemoglobin: 13.3 g/dL (ref 12.0–15.0)
MCH: 30 pg (ref 26.0–34.0)
MCHC: 35.2 g/dL (ref 30.0–36.0)
MCV: 85.3 fL (ref 80.0–100.0)
Platelets: 269 10*3/uL (ref 150–400)
RBC: 4.43 MIL/uL (ref 3.87–5.11)
RDW: 13.1 % (ref 11.5–15.5)
WBC: 9.5 10*3/uL (ref 4.0–10.5)
nRBC: 0 % (ref 0.0–0.2)

## 2022-06-18 LAB — BASIC METABOLIC PANEL
Anion gap: 11 (ref 5–15)
BUN: 9 mg/dL (ref 6–20)
CO2: 23 mmol/L (ref 22–32)
Calcium: 9.5 mg/dL (ref 8.9–10.3)
Chloride: 95 mmol/L — ABNORMAL LOW (ref 98–111)
Creatinine, Ser: 0.76 mg/dL (ref 0.44–1.00)
GFR, Estimated: >60 mL/min (ref >60)
Glucose, Bld: 98 mg/dL (ref 70–99)
Potassium: 2.9 mmol/L — ABNORMAL LOW (ref 3.5–5.1)
Sodium: 129 mmol/L — ABNORMAL LOW (ref 135–145)

## 2022-06-18 LAB — CULTURE, OB URINE: Culture: 30000 — AB

## 2022-06-18 LAB — I-STAT BETA HCG BLOOD, ED (MC, WL, AP ONLY): I-stat hCG, quantitative: >2000 m[IU]/mL — ABNORMAL HIGH (ref <5)

## 2022-06-18 LAB — TROPONIN I (HIGH SENSITIVITY): Troponin I (High Sensitivity): 5 ng/L (ref <18)

## 2022-06-18 MED ORDER — SODIUM CHLORIDE 0.9 % IV BOLUS
500.0000 mL | Freq: Once | INTRAVENOUS | Status: AC
  Administered 2022-06-18: 500 mL via INTRAVENOUS

## 2022-06-18 MED ORDER — POTASSIUM CHLORIDE CRYS ER 20 MEQ PO TBCR
40.0000 meq | EXTENDED_RELEASE_TABLET | Freq: Once | ORAL | Status: AC
  Administered 2022-06-18: 40 meq via ORAL
  Filled 2022-06-18: qty 2

## 2022-06-18 NOTE — MAU Note (Signed)
.  Mackenzie Allen is a 27 y.o. at [redacted]w[redacted]d here in MAU reporting: Pt reports chest feels heavy and "her heart is about to beat out of my chest"-pt denies recent cold or flu symptoms. Started new medication -that she took at 1030 this am for her BP. Seen in MAU yesterday with hypertension. Came in with hyperemesis. Today awoke from a nap and felt she couldn't walk from the bed to the bath room without feeling pressure in her chest. States "its not pain-when I try to take a deep breath my heart beats faster" Melanie Bhambri in-stat EKG ordered-ER MD called with report per CNM. Pt does not have any pregnancy complaints, Respirations are even and unlabored. Pt alert and oriented x 4  Onset of complaint: 1330 Pain score: 0-reports  Vitals:   06/18/22 0213  BP: 128/80  Pulse: 99  Resp: 18  SpO2: 100%      Lab orders placed from triage:  EKG

## 2022-06-18 NOTE — Discharge Instructions (Addendum)
  Return for any problem.  Counseling Services are available to you at [facility].  This is located at [address] They are open 24/7. [phone].

## 2022-06-18 NOTE — MAU Provider Note (Signed)
S Ms. Mackenzie Allen is a 27 y.o. G32P1011 female who presents to MAU today with complaint of heaviness of chest and fast heart beat. Reports starting Procardia earlier today. No pregnancy complaints.   ROS: +CP  O BP 128/80 (BP Location: Right Arm)   Pulse 99   Resp 18   LMP 04/23/2022   SpO2 100%  Physical Exam Vitals and nursing note reviewed.  Constitutional:      General: She is not in acute distress.    Appearance: Normal appearance.  HENT:     Head: Normocephalic and atraumatic.  Cardiovascular:     Rate and Rhythm: Normal rate.  Pulmonary:     Effort: Pulmonary effort is normal. No respiratory distress.  Musculoskeletal:        General: Normal range of motion.     Cervical back: Normal range of motion.  Neurological:     General: No focal deficit present.     Mental Status: She is alert and oriented to person, place, and time.  Psychiatric:        Mood and Affect: Mood normal.        Behavior: Behavior normal.    MDM: No OB complication identified. Rec transfer to ED for further eval. EKG ordered. Spoke with Dr. Bebe Shaggy who accepts.   A 1. [redacted] weeks gestation of pregnancy   2. Chest pain, unspecified type     P Transfer to [facility] for further evaluation  Patient may return to MAU as needed for pregnancy related complaints  Donette Larry, [place] 06/18/2022 2:33 AM

## 2022-06-18 NOTE — ED Provider Notes (Signed)
[place] EMERGENCY DEPARTMENT AT [facility] Provider Note   CSN: [id number] Arrival date & time: 06/18/22  7829     History  Chief Complaint  Patient presents with   Tachycardia   Shortness of Breath    Mackenzie Allen is a 27 y.o. female.  27 year old female presents from home for evaluation.  Patient reports that she is [redacted] weeks pregnant.  Patient reports that she has been having fairly constant palpitations for the last 4 to 5 days.  She reports that she was seen by MAU in the middle the night.  She was told that her baby was fine.  She was referred to the ED for evaluation.  On arrival to the ED the patient is visibly distraught.  She is tearful and anxious.  She endorses palpitations.  She endorses significant stress related to being pregnant and recent death in family.  The history is provided by the patient and medical records.       Home Medications Prior to Admission medications   Medication Sig Start Date End Date Taking? Authorizing Provider  acetaminophen (TYLENOL) 325 MG tablet Take 2 tablets (650 mg total) by mouth every 4 (four) hours as needed (for pain scale < 4). Patient not taking: Reported on 10/05/2020 09/07/20   Alicia Amel, MD  metoCLOPramide (REGLAN) 10 MG tablet Take 1 tablet (10 mg total) by mouth every 6 (six) hours. 06/16/22   Rolm Bookbinder, CNM  NIFEdipine (PROCARDIA-XL/NIFEDICAL-XL) 30 MG 24 hr tablet Take 1 tablet (30 mg total) by mouth daily. 06/16/22 06/16/23  Rolm Bookbinder, CNM  ondansetron (ZOFRAN-ODT) 8 MG disintegrating tablet Take 1 tablet (8 mg total) by mouth every 8 (eight) hours as needed for nausea or vomiting. 06/16/22   Rolm Bookbinder, CNM  promethazine (PHENERGAN) 25 MG tablet Take 1 tablet (25 mg total) by mouth every 6 (six) hours as needed for nausea or vomiting. 06/16/22   Rolm Bookbinder, CNM  scopolamine (TRANSDERM-SCOP) 1 MG/3DAYS Place 1 patch (1.5 mg total) onto the skin every 3 (three)  days. 06/19/22   Rolm Bookbinder, CNM      Allergies    Patient has no known allergies.    Review of Systems   Review of Systems  All other systems reviewed and are negative.   Physical Exam Updated Vital Signs BP (!) 106/93 (BP Location: Right Arm)   Pulse (!) 42   Temp 98.1 F (36.7 C)   Resp 18   Ht 5\' 7"  (1.702 m)   Wt 54.4 kg   LMP 04/23/2022   SpO2 95%   BMI 18.79 kg/m  Physical Exam Vitals and nursing note reviewed.  Constitutional:      General: She is not in acute distress.    Appearance: Normal appearance. She is well-developed.     Comments: Tearful, anxious  HENT:     Head: Normocephalic and atraumatic.  Eyes:     Conjunctiva/sclera: Conjunctivae normal.     Pupils: Pupils are equal, round, and reactive to light.  Cardiovascular:     Rate and Rhythm: Normal rate and regular rhythm.     Heart sounds: Normal heart sounds.  Pulmonary:     Effort: Pulmonary effort is normal. No respiratory distress.     Breath sounds: Normal breath sounds.  Abdominal:     General: There is no distension.     Palpations: Abdomen is soft.     Tenderness: There is no abdominal tenderness.  Musculoskeletal:  General: No deformity. Normal range of motion.     Cervical back: Normal range of motion and neck supple.  Skin:    General: Skin is warm and dry.  Neurological:     General: No focal deficit present.     Mental Status: She is alert and oriented to person, place, and time.     ED Results / Procedures / Treatments   Labs (all labs ordered are listed, but only abnormal results are displayed) Labs Reviewed  BASIC METABOLIC PANEL - Abnormal; Notable for the following components:      Result Value   Sodium 129 (*)    Potassium 2.9 (*)    Chloride 95 (*)    All other components within normal limits  I-STAT BETA HCG BLOOD, ED ([facility], [facility], [facility] ONLY) - Abnormal; Notable for the following components:   I-stat hCG, quantitative >2,000.0 (*)    All other components  within normal limits  CBC  TROPONIN I (HIGH SENSITIVITY)    EKG EKG Interpretation  Date/Time:  Sunday June 18 2022 09:41:50 EDT Ventricular Rate:  94 PR Interval:  148 QRS Duration: 82 QT Interval:  344 QTC Calculation: 430 R Axis:   73 Text Interpretation: Undetermined rhythm Nonspecific T wave abnormality Abnormal ECG When compared with ECG of 26-Mar-2017 17:13, PREVIOUS ECG IS PRESENT Confirmed by Kristine Royal [phone]) on 06/18/2022 11:19:03 AM  Radiology DG Chest 2 View  Result Date: 06/18/2022 CLINICAL DATA:  Chest pain EXAM: CHEST - 2 VIEW COMPARISON:  April 25, 2015 FINDINGS: The heart size and mediastinal contours are within normal limits. Both lungs are clear. The visualized skeletal structures are unremarkable. IMPRESSION: No active cardiopulmonary disease. Electronically Signed   By: Gerome Sam III M.D.   On: 06/18/2022 11:23    Procedures Procedures    Medications Ordered in ED Medications  sodium chloride 0.9 % bolus 500 mL (500 mLs Intravenous New Bag/Given 06/18/22 1119)  potassium chloride SA (KLOR-CON M) CR tablet 40 mEq (40 mEq Oral Given 06/18/22 1132)    ED Course/ Medical Decision Making/ [name]                             Medical Decision Making Amount and/or Complexity of Data Reviewed Labs: ordered. Radiology: ordered.  Risk Prescription drug management.    Medical Screen Complete  This patient presented to the ED with complaint of palpitations, anxiety.  This complaint involves an extensive number of treatment options. The initial differential diagnosis includes, but is not limited to, arrhythmia, metabolic abnormality, etc.  This presentation is: Acute, Self-Limited, Previously Undiagnosed, Uncertain Prognosis, Complicated, Systemic Symptoms, and Threat to Life/Bodily Function  Patient is presenting with complaint of palpitations.  She associates this with significant anxiety.  She is [redacted] weeks pregnant.  Patient has already been evaluated  by MAU early this morning and told that her pregnancy was fine.  On initial evaluation here in the ED the patient is tearful, distraught, anxious.  After evaluation she feels significantly improved and appears to be much calmer.  Screening labs obtained are without significant abnormality.  Notably chest x-ray is without acute abnormality.  Troponin is barely detectable at 5.  Other screening labs are without significant acute abnormality.  Patient offered continued observation and check of second troponin.  She declined same.  She reports that she feels significantly improved and desires discharge.  Although patient's presentation is not consistent with pulmonary embolus -patient offered CTA chest.  Patient declines this.  She is concerned about radiation exposure.  Patient is advised that if her symptoms return or worsen she should absolutely seek medical attention.  Additional history obtained: External records from outside sources obtained and reviewed including prior ED visits and prior Inpatient records.    Lab Tests:  I ordered and personally interpreted labs.  Imaging Studies ordered:  I ordered imaging studies including CXR  I independently visualized and interpreted obtained imaging which showed nad I agree with the radiologist interpretation.   Cardiac Monitoring:  The patient was maintained on a cardiac monitor.  I personally viewed and interpreted the cardiac monitor which showed an underlying rhythm of: NSR   Problem List / ED Course:  Palpitations    Reevaluation:  After the interventions noted above, I reevaluated the patient and found that they have: resolved  Disposition:  After consideration of the diagnostic results and the patients response to treatment, I feel that the patent would benefit from close outpatient followup.          Final Clinical Impression(s) / ED Diagnoses Final diagnoses:  None    Rx / DC Orders ED Discharge Orders      None         Wynetta Fines, MD 06/18/22 1222

## 2022-06-18 NOTE — MAU Note (Signed)
Pt transferred to ER triage-stable for transfer. Report called to ED RN-Whitney

## 2022-06-18 NOTE — ED Notes (Signed)
Pt left and said she will go to [employer]

## 2022-06-18 NOTE — ED Triage Notes (Signed)
Pt arrived POV from home c/o [facility] and feeling like her heart is beating out of her chest. Pt states she is [redacted] weeks pregnant and tried going to MAU. Pt is anxious and states she feels like she cannot breathe.

## 2022-06-20 ENCOUNTER — Other Ambulatory Visit: Payer: Medicaid Other

## 2022-06-20 ENCOUNTER — Emergency Department (HOSPITAL_COMMUNITY): Payer: Medicaid Other

## 2022-06-20 ENCOUNTER — Encounter (HOSPITAL_COMMUNITY): Payer: Self-pay | Admitting: Radiology

## 2022-06-20 ENCOUNTER — Emergency Department (HOSPITAL_COMMUNITY)
Admission: EM | Admit: 2022-06-20 | Discharge: 2022-06-20 | Disposition: A | Discharge status: Home / Self Care | Payer: Medicaid Other | Attending: Emergency Medicine | Admitting: Emergency Medicine

## 2022-06-20 DIAGNOSIS — O99281 Endocrine, nutritional and metabolic diseases complicating pregnancy, first trimester: Secondary | ICD-10-CM | POA: Diagnosis not present

## 2022-06-20 DIAGNOSIS — E876 Hypokalemia: Secondary | ICD-10-CM | POA: Diagnosis not present

## 2022-06-20 DIAGNOSIS — R002 Palpitations: Secondary | ICD-10-CM

## 2022-06-20 LAB — CBC WITH DIFFERENTIAL/PLATELET
Abs Immature Granulocytes: 0.02 10*3/uL (ref 0.00–0.07)
Basophils Absolute: 0 10*3/uL (ref 0.0–0.1)
Basophils Relative: 0 %
Eosinophils Absolute: 0 10*3/uL (ref 0.0–0.5)
Eosinophils Relative: 0 %
HCT: 36.3 % (ref 36.0–46.0)
Hemoglobin: 12.5 g/dL (ref 12.0–15.0)
Immature Granulocytes: 0 %
Lymphocytes Relative: 20 %
Lymphs Abs: 1.9 10*3/uL (ref 0.7–4.0)
MCH: 29.6 pg (ref 26.0–34.0)
MCHC: 34.4 g/dL (ref 30.0–36.0)
MCV: 86 fL (ref 80.0–100.0)
Monocytes Absolute: 0.6 10*3/uL (ref 0.1–1.0)
Monocytes Relative: 6 %
Neutro Abs: 7.1 10*3/uL (ref 1.7–7.7)
Neutrophils Relative %: 74 %
Platelets: 242 10*3/uL (ref 150–400)
RBC: 4.22 MIL/uL (ref 3.87–5.11)
RDW: 13.2 % (ref 11.5–15.5)
WBC: 9.6 10*3/uL (ref 4.0–10.5)
nRBC: 0 % (ref 0.0–0.2)

## 2022-06-20 LAB — BASIC METABOLIC PANEL
Anion gap: 6 (ref 5–15)
BUN: 10 mg/dL (ref 6–20)
CO2: 23 mmol/L (ref 22–32)
Calcium: 8.9 mg/dL (ref 8.9–10.3)
Chloride: 100 mmol/L (ref 98–111)
Creatinine, Ser: 0.57 mg/dL (ref 0.44–1.00)
GFR, Estimated: >60 mL/min (ref >60)
Glucose, Bld: 91 mg/dL (ref 70–99)
Potassium: 3.1 mmol/L — ABNORMAL LOW (ref 3.5–5.1)
Sodium: 129 mmol/L — ABNORMAL LOW (ref 135–145)

## 2022-06-20 LAB — TSH: TSH: 0.066 u[IU]/mL — ABNORMAL LOW (ref 0.350–4.500)

## 2022-06-20 LAB — TROPONIN I (HIGH SENSITIVITY): Troponin I (High Sensitivity): 2 ng/L (ref <18)

## 2022-06-20 MED ORDER — IOHEXOL 350 MG/ML SOLN
100.0000 mL | Freq: Once | INTRAVENOUS | Status: AC | PRN
  Administered 2022-06-20: 63 mL via INTRAVENOUS

## 2022-06-20 MED ORDER — POTASSIUM CHLORIDE CRYS ER 20 MEQ PO TBCR
40.0000 meq | EXTENDED_RELEASE_TABLET | Freq: Once | ORAL | Status: AC
  Administered 2022-06-20: 40 meq via ORAL
  Filled 2022-06-20: qty 2

## 2022-06-20 MED ORDER — POTASSIUM CHLORIDE ER 20 MEQ PO TBCR: 20.0000 meq | EXTENDED_RELEASE_TABLET | Freq: Every day | ORAL | 0 refills | Status: DC

## 2022-06-20 MED ORDER — SODIUM CHLORIDE (PF) 0.9 % IJ SOLN
INTRAMUSCULAR | Status: AC
  Filled 2022-06-20: qty 50

## 2022-06-20 MED ORDER — ACETAMINOPHEN 500 MG PO TABS
1000.0000 mg | ORAL_TABLET | Freq: Once | ORAL | Status: AC
  Administered 2022-06-20: 1000 mg via ORAL
  Filled 2022-06-20: qty 2

## 2022-06-20 NOTE — ED Triage Notes (Signed)
Pt BIB[facility] from [facility] with c/o "knot" in her chest. Pt found out she is [redacted] weeks pregnant this past week. Pt has had a loss of family members so it could be related to stress and anxiety. Pt started BP meds x1 week, last dose taken was this morning. "Heart is beating out of my chest." Chest pain is intermittent, almost like palpitations.    BP 140/80 HR 98 92% RA

## 2022-06-20 NOTE — Discharge Instructions (Addendum)
Return for any problem.   Continue plans for follow-up with cardiology and behavioral health as previously instructed.  Drink plenty of fluids.  Take potassium supplementation as instructed.

## 2022-06-20 NOTE — ED Provider Notes (Signed)
[place] EMERGENCY DEPARTMENT AT [facility] Provider Note   CSN: [id number] Arrival date & time: 06/20/22  4098     History  Chief Complaint  Patient presents with   Chest Pain   Anxiety    Mackenzie Allen is a 27 y.o. female.  27 y.o. G78P1011 female, approximately 8.5 weeks.  She presents today with complaint of persistent chest heaviness and palpitations and rapid heart rate.  Patient seen for similar complaint 2 days prior.  Workup at that time was without significant abnormal findings.  Patient reports that this morning while she was at [facility] she developed recurrent symptoms.  She decided to come to the ED for repeat evaluation.  Patient admits to significant stress and anxiety over the last several weeks.  After last ED evaluation the patient has attempted to obtain outpatient counseling and outpatient cardiology evaluation.  These evaluations have not yet occurred.  At time of last ED evaluation the patient declined CT angio to rule out PE.  The history is provided by the patient and medical records.       Home Medications Prior to Admission medications   Medication Sig Start Date End Date Taking? Authorizing Provider  acetaminophen (TYLENOL) 325 MG tablet Take 2 tablets (650 mg total) by mouth every 4 (four) hours as needed (for pain scale < 4). Patient not taking: Reported on 10/05/2020 09/07/20   Alicia Amel, MD  metoCLOPramide (REGLAN) 10 MG tablet Take 1 tablet (10 mg total) by mouth every 6 (six) hours. 06/16/22   Rolm Bookbinder, CNM  NIFEdipine (PROCARDIA-XL/NIFEDICAL-XL) 30 MG 24 hr tablet Take 1 tablet (30 mg total) by mouth daily. 06/16/22 06/16/23  Rolm Bookbinder, CNM  ondansetron (ZOFRAN-ODT) 8 MG disintegrating tablet Take 1 tablet (8 mg total) by mouth every 8 (eight) hours as needed for nausea or vomiting. 06/16/22   Rolm Bookbinder, CNM  promethazine (PHENERGAN) 25 MG tablet Take 1 tablet (25 mg total) by mouth every 6  (six) hours as needed for nausea or vomiting. 06/16/22   Rolm Bookbinder, CNM  scopolamine (TRANSDERM-SCOP) 1 MG/3DAYS Place 1 patch (1.5 mg total) onto the skin every 3 (three) days. 06/19/22   Rolm Bookbinder, CNM      Allergies    Patient has no known allergies.    Review of Systems   Review of Systems  All other systems reviewed and are negative.   Physical Exam Updated Vital Signs BP 123/84 (BP Location: Right Arm)   Pulse 94   Temp 98.1 F (36.7 C) (Oral)   Resp 18   LMP 04/23/2022   SpO2 100%  Physical Exam Vitals and nursing note reviewed.  Constitutional:      General: She is not in acute distress.    Appearance: Normal appearance.     Comments: Alert, anxious, complains of chest discomfort and palpitations  HENT:     Head: Normocephalic and atraumatic.  Eyes:     Conjunctiva/sclera: Conjunctivae normal.     Pupils: Pupils are equal, round, and reactive to light.  Cardiovascular:     Rate and Rhythm: Normal rate and regular rhythm.     Heart sounds: Normal heart sounds.  Pulmonary:     Effort: Pulmonary effort is normal. No respiratory distress.     Breath sounds: Normal breath sounds.  Abdominal:     General: There is no distension.     Palpations: Abdomen is soft.     Tenderness: There is no abdominal  tenderness.  Musculoskeletal:        General: No deformity. Normal range of motion.     Cervical back: Normal range of motion and neck supple.  Skin:    General: Skin is warm and dry.  Neurological:     General: No focal deficit present.     Mental Status: She is alert and oriented to person, place, and time.     ED Results / Procedures / Treatments   Labs (all labs ordered are listed, but only abnormal results are displayed) Labs Reviewed  CBC WITH DIFFERENTIAL/PLATELET  BASIC METABOLIC PANEL  TSH  TROPONIN I (HIGH SENSITIVITY)    EKG EKG Interpretation  Date/Time:  Tuesday June 20 2022 09:26:03 EDT Ventricular Rate:  95 PR  Interval:  135 QRS Duration: 72 QT Interval:  358 QTC Calculation: 450 R Axis:   69 Text Interpretation: Normal sinus rhythm Reconfirmed by Kristine Royal [phone]) on 06/20/2022 9:29:06 AM  Radiology DG Chest 2 View  Result Date: 06/18/2022 CLINICAL DATA:  Chest pain EXAM: CHEST - 2 VIEW COMPARISON:  April 25, 2015 FINDINGS: The heart size and mediastinal contours are within normal limits. Both lungs are clear. The visualized skeletal structures are unremarkable. IMPRESSION: No active cardiopulmonary disease. Electronically Signed   By: Gerome Sam III M.D.   On: 06/18/2022 11:23    Procedures Procedures    Medications Ordered in ED Medications - No data to display  ED Course/ Medical Decision Making/ [name]                             Medical Decision Making Amount and/or Complexity of Data Reviewed Labs: ordered. Radiology: ordered.  Risk OTC drugs. Prescription drug management.    Medical Screen Complete  This patient presented to the ED with complaint of chest pain, palpitations, pregnant.  This complaint involves an extensive number of treatment options. The initial differential diagnosis includes, but is not limited to, anxiety, palpitations secondary to stress, PE, metabolic abnormality, thyroid dysfunction, etc.  This presentation is: Acute, Chronic, Self-Limited, Previously Undiagnosed, Uncertain Prognosis, Complicated, Systemic Symptoms, and Threat to Life/Bodily Function  27 y.o. [id number] female, currently 8.[redacted] weeks pregnant, presents with persistent complaint of palpitations, chest discomfort.  This is associate with intermittent shortness of breath.  Patient without prior history of DVT, PE.  Prior workups both in the ED and also with the MAU have not revealed significant acute pathology.  As of last ED evaluation patient was recommended for outpatient cardiology and anxiety counseling.  Patient has not yet been able to establish outpatient follow-up for these  issues.  Patient is returning today with persistent symptoms.  Extensively discussed workup today with the patient.  Patient is agreeable with plan for CTA today to rule out PE.  All risks and benefits discussed extensively with patient.  CT is fortunately without significant pathology.  Other screening labs are without significant abnormality.  TSH is below normal but this is to be expected early pregnancy.  Patient does have established follow-up with GYN.  Patient has plans for follow-up with both outpatient cardiology and also with behavioral health regarding anxiety/stress.  Patient's potassium is mildly below normal and this was supplemented.  Patient is agreeable with taking oral potassium supplementation in the outpatient setting.  I doubt that patient's hypokalemia is significant enough to cause symptoms.  Close follow-up was stressed.  Strict return precautions given and understood.   Additional history obtained: External records from  outside sources obtained and reviewed including prior ED visits and prior Inpatient records.    Lab Tests:  I ordered and personally interpreted labs.  Imaging Studies ordered:  I ordered imaging studies including CT angio I independently visualized and interpreted obtained imaging which showed no evidence of PE I agree with the radiologist interpretation.   Cardiac Monitoring:  The patient was maintained on a cardiac monitor.  I personally viewed and interpreted the cardiac monitor which showed an underlying rhythm of: Normal sinus rhythm   Medicines ordered:  I ordered medication including potassium for mild hypokalemia Reevaluation of the patient after these medicines showed that the patient: improved   Problem List / ED Course:  Palpitations, hypokalemia   Reevaluation:  After the interventions noted above, I reevaluated the patient and found that they have: improved  Disposition:  After consideration of the diagnostic  results and the patients response to treatment, I feel that the patent would benefit from close outpatient follow-up.          Final Clinical Impression(s) / ED Diagnoses Final diagnoses:  Palpitations  Hypokalemia    Rx / DC Orders ED Discharge Orders          Ordered    potassium chloride 20 MEQ TBCR  Daily        06/20/22 1218              Wynetta Fines, MD 06/20/22 1222

## 2022-07-27 ENCOUNTER — Ambulatory Visit (INDEPENDENT_AMBULATORY_CARE_PROVIDER_SITE_OTHER): Payer: Medicaid Other | Admitting: Licensed Clinical Social Worker

## 2022-07-27 ENCOUNTER — Encounter: Payer: Self-pay | Admitting: Obstetrics and Gynecology

## 2022-07-27 ENCOUNTER — Other Ambulatory Visit (HOSPITAL_COMMUNITY): Admission: RE | Admit: 2022-07-27 | Payer: Medicaid Other | Source: Ambulatory Visit

## 2022-07-27 ENCOUNTER — Ambulatory Visit (INDEPENDENT_AMBULATORY_CARE_PROVIDER_SITE_OTHER): Payer: Medicaid Other | Admitting: Obstetrics and Gynecology

## 2022-07-27 VITALS — BP 105/67 | HR 88 | Wt 128.0 lb

## 2022-07-27 DIAGNOSIS — Z8619 Personal history of other infectious and parasitic diseases: Secondary | ICD-10-CM

## 2022-07-27 DIAGNOSIS — O10919 Unspecified pre-existing hypertension complicating pregnancy, unspecified trimester: Secondary | ICD-10-CM

## 2022-07-27 DIAGNOSIS — Z3A13 13 weeks gestation of pregnancy: Secondary | ICD-10-CM

## 2022-07-27 DIAGNOSIS — Z3480 Encounter for supervision of other normal pregnancy, unspecified trimester: Secondary | ICD-10-CM

## 2022-07-27 DIAGNOSIS — F4322 Adjustment disorder with anxiety: Secondary | ICD-10-CM

## 2022-07-27 DIAGNOSIS — O10911 Unspecified pre-existing hypertension complicating pregnancy, first trimester: Secondary | ICD-10-CM

## 2022-07-27 HISTORY — DX: Encounter for supervision of other normal pregnancy, unspecified trimester: Z34.80

## 2022-07-27 MED ORDER — ASPIRIN 81 MG PO CHEW
81.0000 mg | CHEWABLE_TABLET | Freq: Every day | ORAL | 5 refills | Status: DC
Start: 2022-07-27 — End: 2023-01-19

## 2022-07-27 NOTE — Progress Notes (Signed)
INITIAL PRENATAL VISIT NOTE  Subjective:  Mackenzie Allen is a 27 y.o. G3P1011 at [redacted]w[redacted]d by LMP being seen today for her initial prenatal visit.  She has an obstetric history significant for SVD. She has a medical history significant for chronic hypertension.  Patient reports no complaints.  Contractions: Not present. Vag. Bleeding: None.   . Denies leaking of fluid.    Past Medical History:  Diagnosis Date   Herpes genitalia    Hypertension    Medical history non-contributory     Past Surgical History:  Procedure Laterality Date   WRIST SURGERY      OB History  Gravida Para Term Preterm AB Living  3 1 1   1 1   SAB IAB Ectopic Multiple Live Births  1     0 1    # Outcome Date GA Lbr Len/2nd Weight Sex Type Anes PTL Lv  3 Current           2 SAB 2023          1 Term 09/06/20 [redacted]w[redacted]d 08:00 / 00:30 6 lb 5.6 oz (2.88 kg) M Vag-Spont EPI  LIV    Social History   Socioeconomic History   Marital status: Single    Spouse name: Not on file   Number of children: Not on file   Years of education: Not on file   Highest education level: Not on file  Occupational History   Occupation: [employer]  Tobacco Use   Smoking status: Former    Types: Cigars   Smokeless tobacco: Never   Tobacco comments:    1 Cigar a day   Vaping Use   Vaping status: Never Used  Substance and Sexual Activity   Alcohol use: Not Currently   Drug use: Not Currently    Types: Marijuana    Comment: not since becoming pregnant   Sexual activity: Yes  Other Topics Concern   Not on file  Social History Narrative   Not on file   Social Determinants of Health   Financial Resource Strain: Not on file  Food Insecurity: Not on file  Transportation Needs: Not on file  Physical Activity: Not on file  Stress: Not on file  Social Connections: Unknown (05/31/2021)   Received from [facility]   Social Network    Social Network: Not on file    Family History  Problem Relation Age of  Onset   Asthma Mother    Varicose Veins Mother    Asthma Brother    Asthma Maternal Grandmother    Diabetes Maternal Grandmother    Heart disease Maternal Grandmother    Hypertension Maternal Grandmother    Arthritis Paternal Grandmother      Current Outpatient Medications:    aspirin 81 MG chewable tablet, Chew 1 tablet (81 mg total) by mouth daily. Start at 15 weeks, Disp: 30 tablet, Rfl: 5   NIFEdipine (PROCARDIA-XL/NIFEDICAL-XL) 30 MG 24 hr tablet, Take 1 tablet (30 mg total) by mouth daily., Disp: 30 tablet, Rfl: 11   Prenatal Vit-Fe Fumarate-FA (MULTIVITAMIN-PRENATAL) 27-0.8 MG TABS tablet, Take 1 tablet by mouth daily at 12 noon., Disp: , Rfl:    acetaminophen (TYLENOL) 325 MG tablet, Take 2 tablets (650 mg total) by mouth every 4 (four) hours as needed (for pain scale < 4). (Patient not taking: Reported on 10/05/2020), Disp: , Rfl:    metoCLOPramide (REGLAN) 10 MG tablet, Take 1 tablet (10 mg total) by mouth every 6 (six) hours., Disp: 30 tablet,  Rfl: 2   ondansetron (ZOFRAN-ODT) 8 MG disintegrating tablet, Take 1 tablet (8 mg total) by mouth every 8 (eight) hours as needed for nausea or vomiting., Disp: 30 tablet, Rfl: 2   potassium chloride 20 MEQ TBCR, Take 1 tablet (20 mEq total) by mouth daily., Disp: 20 tablet, Rfl: 0   promethazine (PHENERGAN) 25 MG tablet, Take 1 tablet (25 mg total) by mouth every 6 (six) hours as needed for nausea or vomiting., Disp: 30 tablet, Rfl: 2   scopolamine (TRANSDERM-SCOP) 1 MG/3DAYS, Place 1 patch (1.5 mg total) onto the skin every 3 (three) days., Disp: 10 patch, Rfl: 12  No Known Allergies  Review of Systems: Negative except for what is mentioned in HPI.  Objective:   Vitals:   07/27/22 1434  BP: 105/67  Pulse: 88  Weight: 128 lb (58.1 kg)    Fetal Status: Fetal Heart Rate (bpm): 140         Physical Exam: BP 105/67   Pulse 88   Wt 128 lb (58.1 kg)   LMP 04/23/2022   BMI 20.05 kg/m  CONSTITUTIONAL: Well-developed,  well-nourished female in no acute distress.  NEUROLOGIC: Alert and oriented to person, place, and time. Normal reflexes, muscle tone coordination. No cranial nerve deficit noted. PSYCHIATRIC: Normal mood and affect. Normal behavior. Normal judgment and thought content. SKIN: Skin is warm and dry. No rash noted. Not diaphoretic. No erythema. No pallor. HENT:  Normocephalic, atraumatic, External right and left ear normal. Oropharynx is clear and moist EYES: Conjunctivae and EOM are normal.  NECK: Normal range of motion, supple, no masses CARDIOVASCULAR: Normal heart rate noted, regular rhythm RESPIRATORY: Effort and breath sounds normal, no problems with respiration noted BREASTS: defer ABDOMEN: Soft, nontender, nondistended, gravid. MUSCULOSKELETAL: Normal range of motion. EXT:  No edema and no tenderness. 2+ distal pulses.   Assessment and Plan:  Pregnancy: G3P1011 at [redacted]w[redacted]d by LMP  1. Supervision of other normal pregnancy, antepartum Continue routine prenatal care  - CBC/D/Plt+RPR+Rh+ABO+RubIgG... - Culture, OB Urine - HgB A1c - PANORAMA PRENATAL TEST - Cervicovaginal ancillary only( [place]) - [place] MFM OB COMP + 14 WK; Future  2. [redacted] weeks gestation of pregnancy   3. History of herpes genitalis Prophylaxis at 36 weeks  4. Chronic hypertension during pregnancy, antepartum Baseline labs added Start baby ASA for prophylaxis  - Comprehensive metabolic panel - Protein / creatinine ratio, urine - aspirin 81 MG chewable tablet; Chew 1 tablet (81 mg total) by mouth daily. Start at 15 weeks  Dispense: 30 tablet; Refill: 5   Preterm labor symptoms and general obstetric precautions including but not limited to vaginal bleeding, contractions, leaking of fluid and fetal movement were reviewed in detail with the patient.  Please refer to After Visit Summary for other counseling recommendations.   Return in about 4 weeks (around 08/24/2022) for [facility], in person.  Warden Fillers 07/27/2022 3:20 PM

## 2022-07-28 LAB — CBC/D/PLT+RPR+RH+ABO+RUBIGG...
Antibody Screen: NEGATIVE
Basophils Absolute: 0 10*3/uL (ref 0.0–0.2)
Basos: 0 %
EOS (ABSOLUTE): 0.1 10*3/uL (ref 0.0–0.4)
Eos: 1 %
HCV Ab: NONREACTIVE
HIV Screen 4th Generation wRfx: NONREACTIVE
Hematocrit: 35 % (ref 34.0–46.6)
Hemoglobin: 11.8 g/dL (ref 11.1–15.9)
Hepatitis B Surface Ag: NEGATIVE
Immature Grans (Abs): 0.1 10*3/uL (ref 0.0–0.1)
Immature Granulocytes: 1 %
Lymphocytes Absolute: 1.5 10*3/uL (ref 0.7–3.1)
Lymphs: 16 %
MCH: 30.2 pg (ref 26.6–33.0)
MCHC: 33.7 g/dL (ref 31.5–35.7)
MCV: 90 fL (ref 79–97)
Monocytes Absolute: 0.4 10*3/uL (ref 0.1–0.9)
Monocytes: 5 %
Neutrophils Absolute: 7.3 10*3/uL — ABNORMAL HIGH (ref 1.4–7.0)
Neutrophils: 77 %
Platelets: 278 10*3/uL (ref 150–450)
RBC: 3.91 x10E6/uL (ref 3.77–5.28)
RDW: 14.4 % (ref 11.7–15.4)
RPR Ser Ql: NONREACTIVE
Rh Factor: POSITIVE
Rubella Antibodies, IGG: 2.76 index (ref >0.99)
WBC: 9.3 10*3/uL (ref 3.4–10.8)

## 2022-07-28 LAB — COMPREHENSIVE METABOLIC PANEL
ALT: 9 IU/L (ref 0–32)
AST: 12 IU/L (ref 0–40)
Albumin: 4.3 g/dL (ref 4.0–5.0)
Alkaline Phosphatase: 78 IU/L (ref 44–121)
BUN/Creatinine Ratio: 21 (ref 9–23)
BUN: 14 mg/dL (ref 6–20)
Bilirubin Total: <0.2 mg/dL (ref 0.0–1.2)
CO2: 22 mmol/L (ref 20–29)
Calcium: 9.8 mg/dL (ref 8.7–10.2)
Chloride: 99 mmol/L (ref 96–106)
Creatinine, Ser: 0.68 mg/dL (ref 0.57–1.00)
Globulin, Total: 3 g/dL (ref 1.5–4.5)
Glucose: 70 mg/dL (ref 70–99)
Potassium: 4 mmol/L (ref 3.5–5.2)
Sodium: 136 mmol/L (ref 134–144)
Total Protein: 7.3 g/dL (ref 6.0–8.5)
eGFR: 123 mL/min/{1.73_m2} (ref >59)

## 2022-07-28 LAB — PROTEIN / CREATININE RATIO, URINE
Creatinine, Urine: 260.3 mg/dL
Protein, Ur: 29.5 mg/dL
Protein/Creat Ratio: 113 mg/g creat (ref 0–200)

## 2022-07-28 LAB — HEMOGLOBIN A1C
Est. average glucose Bld gHb Est-mCnc: 105 mg/dL
Hgb A1c MFr Bld: 5.3 % (ref 4.8–5.6)

## 2022-07-28 LAB — CERVICOVAGINAL ANCILLARY ONLY
Chlamydia: NEGATIVE
Comment: NEGATIVE
Comment: NEGATIVE
Comment: NORMAL
Neisseria Gonorrhea: NEGATIVE
Trichomonas: POSITIVE — AB

## 2022-07-28 LAB — HCV INTERPRETATION

## 2022-07-29 LAB — URINE CULTURE, OB REFLEX

## 2022-07-29 LAB — CULTURE, OB URINE

## 2022-07-31 NOTE — BH Specialist Note (Signed)
Integrated Behavioral Health Follow Up In-Person Visit  MRN: [id number] Name: Mackenzie Allen  Number of Integrated Behavioral Health Clinician visits: 1 Session Start time:  3:00pm Session End time: 3:36pm Total time in minutes: 36 mins in person at [facility]  Types of Service: General Behavioral Integrated Care (BHI)  Interpretor:No. Interpretor Name and Language: none  Subjective: Mackenzie Allen is a 27 y.o. female accompanied by n/a Patient was referred by Dr. Donavan Foil for anxiety. Patient reports the following symptoms/concerns: anxious and depressed mood, difficulty sleeping Duration of problem: over one year; Severity of problem: mild  Objective: Mood: good and Affect: Appropriate Risk of harm to self or others: No plan to harm self or others  Life Context: Family and Social: lives in [place]  School/Work: n/a Self-Care: n/a Life Changes: new pregnancy  Patient and/or Family's Strengths/Protective Factors: Concrete supports in place (healthy food, safe environments, etc.)  Goals Addressed: Patient will:  Reduce symptoms of: anxiety   Increase knowledge and/or ability of: coping skills   Demonstrate ability to: Increase healthy adjustment to current life circumstances  Progress towards Goals: Ongoing  Interventions: Interventions utilized:  Supportive Counseling Standardized Assessments completed: PHQ 9  Patient and/or Family Response: Ms. Schupp reports anxious mood    Assessment: Patient currently experiencing adjustment disorder with anxiety.   Patient may benefit from integrated behavioral health.  Plan: Follow up with behavioral health clinician on : next ob visit Behavioral recommendations: Prioritize rest, stress reducing activity,  Referral(s): Integrated [employer] (In Clinic) "From scale of 1-10, how likely are you to follow plan?":    Gwyndolyn Saxon, LCSW

## 2022-08-02 ENCOUNTER — Telehealth: Payer: Self-pay

## 2022-08-02 DIAGNOSIS — A5901 Trichomonal vulvovaginitis: Secondary | ICD-10-CM

## 2022-08-02 MED ORDER — METRONIDAZOLE 500 MG PO TABS: 500.0000 mg | ORAL_TABLET | Freq: Two times a day (BID) | ORAL | 0 refills | Status: AC

## 2022-08-02 NOTE — Telephone Encounter (Signed)
Spoke with patient using two identifiers. Patient informed of trichomonas noted on vaginal swab. Patient advised to let partner(s) know so partner(s) can be tested and treated. Advised pt to abstain from unprotected intercourse until treatment is completed.   Rx sent to pharmacy.

## 2022-08-02 NOTE — Telephone Encounter (Signed)
-----   Message from Warden Fillers sent at 08/01/2022  8:35 AM EDT ----- Prenatal labs normal other than trichomonas noted on swab, offer routine treatment

## 2022-08-03 ENCOUNTER — Encounter: Payer: Self-pay | Admitting: Obstetrics and Gynecology

## 2022-08-04 ENCOUNTER — Encounter: Payer: Self-pay | Admitting: Obstetrics and Gynecology

## 2022-08-05 LAB — PANORAMA PRENATAL TEST FULL PANEL:PANORAMA TEST PLUS 5 ADDITIONAL MICRODELETIONS: FETAL FRACTION: 18.9

## 2022-08-14 ENCOUNTER — Ambulatory Visit: Payer: Medicaid Other | Attending: Cardiology | Admitting: Cardiology

## 2022-08-24 ENCOUNTER — Telehealth: Payer: Self-pay | Admitting: General Practice

## 2022-08-24 ENCOUNTER — Telehealth: Payer: Medicaid Other | Admitting: Family Medicine

## 2022-09-11 ENCOUNTER — Encounter: Payer: Self-pay | Admitting: *Deleted

## 2022-09-11 ENCOUNTER — Ambulatory Visit: Payer: Medicaid Other | Admitting: *Deleted

## 2022-09-11 ENCOUNTER — Ambulatory Visit: Payer: Medicaid Other | Attending: Obstetrics and Gynecology

## 2022-09-11 ENCOUNTER — Other Ambulatory Visit: Payer: Self-pay | Admitting: *Deleted

## 2022-09-11 VITALS — BP 113/74 | HR 73

## 2022-09-11 DIAGNOSIS — O10912 Unspecified pre-existing hypertension complicating pregnancy, second trimester: Secondary | ICD-10-CM | POA: Diagnosis not present

## 2022-09-11 DIAGNOSIS — Z3A20 20 weeks gestation of pregnancy: Secondary | ICD-10-CM | POA: Insufficient documentation

## 2022-09-11 DIAGNOSIS — Z3480 Encounter for supervision of other normal pregnancy, unspecified trimester: Secondary | ICD-10-CM | POA: Diagnosis present

## 2022-09-11 DIAGNOSIS — O10919 Unspecified pre-existing hypertension complicating pregnancy, unspecified trimester: Secondary | ICD-10-CM

## 2022-09-11 DIAGNOSIS — Z363 Encounter for antenatal screening for malformations: Secondary | ICD-10-CM | POA: Insufficient documentation

## 2022-09-11 DIAGNOSIS — O98512 Other viral diseases complicating pregnancy, second trimester: Secondary | ICD-10-CM | POA: Insufficient documentation

## 2022-09-21 ENCOUNTER — Ambulatory Visit (INDEPENDENT_AMBULATORY_CARE_PROVIDER_SITE_OTHER): Payer: Medicaid Other | Admitting: Obstetrics & Gynecology

## 2022-09-21 ENCOUNTER — Ambulatory Visit (INDEPENDENT_AMBULATORY_CARE_PROVIDER_SITE_OTHER): Payer: Medicaid Other | Admitting: Licensed Clinical Social Worker

## 2022-09-21 ENCOUNTER — Other Ambulatory Visit (HOSPITAL_COMMUNITY)
Admission: RE | Admit: 2022-09-21 | Discharge: 2022-09-21 | Disposition: A | Discharge status: Home / Self Care | Payer: Medicaid Other | Source: Ambulatory Visit | Attending: Obstetrics & Gynecology | Admitting: Obstetrics & Gynecology

## 2022-09-21 VITALS — BP 105/68 | HR 79 | Wt 146.0 lb

## 2022-09-21 DIAGNOSIS — O10912 Unspecified pre-existing hypertension complicating pregnancy, second trimester: Secondary | ICD-10-CM

## 2022-09-21 DIAGNOSIS — A5901 Trichomonal vulvovaginitis: Secondary | ICD-10-CM

## 2022-09-21 DIAGNOSIS — Z3480 Encounter for supervision of other normal pregnancy, unspecified trimester: Secondary | ICD-10-CM

## 2022-09-21 DIAGNOSIS — F4322 Adjustment disorder with anxiety: Secondary | ICD-10-CM | POA: Diagnosis not present

## 2022-09-21 DIAGNOSIS — O10919 Unspecified pre-existing hypertension complicating pregnancy, unspecified trimester: Secondary | ICD-10-CM

## 2022-09-21 DIAGNOSIS — O23592 Infection of other part of genital tract in pregnancy, second trimester: Secondary | ICD-10-CM

## 2022-09-21 DIAGNOSIS — Z3A21 21 weeks gestation of pregnancy: Secondary | ICD-10-CM

## 2022-09-21 NOTE — Progress Notes (Signed)
   PRENATAL VISIT NOTE  Subjective:  Mackenzie Allen is a 26 y.o. G3P1011 at [redacted]w[redacted]d being seen today for ongoing prenatal care.  She is currently monitored for the following issues for this high-risk pregnancy and has Chronic hypertension during pregnancy, antepartum; History of herpes genitalis; Trichomonal vaginitis in pregnancy; and Supervision of other normal pregnancy, antepartum on their problem list.  Patient reports no complaints.  Contractions: Not present. Vag. Bleeding: None.  Movement: Present. Denies leaking of fluid.   The following portions of the patient's history were reviewed and updated as appropriate: allergies, current medications, past family history, past medical history, past social history, past surgical history and problem list.   Objective:   Vitals:   09/21/22 1534  BP: 105/68  Pulse: 79  Weight: 146 lb (66.2 kg)    Fetal Status: Fetal Heart Rate (bpm): 160   Movement: Present     General:  Alert, oriented and cooperative. Patient is in no acute distress.  Skin: Skin is warm and dry. No rash noted.   Cardiovascular: Normal heart rate noted  Respiratory: Normal respiratory effort, no problems with respiration noted  Abdomen: Soft, gravid, appropriate for gestational age.  Pain/Pressure: Present     Pelvic: Cervical exam deferred        Extremities: Normal range of motion.     Mental Status: Normal mood and affect. Normal behavior. Normal judgment and thought content.   Assessment and Plan:  Pregnancy: G3P1011 at [redacted]w[redacted]d 1. Supervision of other normal pregnancy, antepartum TOC for trichomonas  2. Chronic hypertension during pregnancy, antepartum Well controlled  Preterm labor symptoms and general obstetric precautions including but not limited to vaginal bleeding, contractions, leaking of fluid and fetal movement were reviewed in detail with the patient. Please refer to After Visit Summary for other counseling recommendations.   Return in  about 4 weeks (around 10/19/2022).  Future Appointments  Date Time Provider Department Center  09/21/2022  4:00 PM Gwyndolyn Saxon, [place] [facility] None  10/09/2022  3:30 PM [facility] US1 [facility]    Scheryl Darter, MD

## 2022-09-22 LAB — CERVICOVAGINAL ANCILLARY ONLY
Comment: NEGATIVE
Trichomonas: POSITIVE — AB

## 2022-09-22 NOTE — BH Specialist Note (Signed)
Integrated Behavioral Health Follow Up In-Person Visit  MRN: [id number] Name: Mackenzie Allen  Number of Integrated Behavioral Health Clinician visits: 3 Session Start time:  415pm Session End time: 5pm Total time in minutes: 45 mins in person at [facility]   Types of Service: Individual psychotherapy  Interpretor:No. Interpretor Name and Language: none  Subjective: Mackenzie Allen is a 27 y.o. female accompanied by n/a Patient was referred by Dr Debroah Loop for anxiety, stress and grief. Patient reports the following symptoms/concerns: grieving death of brother, depressed mood, stress, and feeling overwhelmed  Duration of problem: over one year ; Severity of problem: mild  Objective: Mood: good and Affect: Appropriate Risk of harm to self or others: No plan to harm self or others  Life Context: Family and Social: Lives with son and partner  School/Work: employed  Self-Care: n/a Life Changes: new pregnancy  Patient and/or Family's Strengths/Protective Factors: Concrete supports in place (healthy food, safe environments, etc.)  Goals Addressed: Patient will:  Reduce symptoms of: anxiety and grief  Increase knowledge and/or ability of: coping skills   Demonstrate ability to: Increase healthy adjustment to current life circumstances  Progress towards Goals: Ongoing  Interventions: Interventions utilized:  Supportive Counseling Standardized Assessments completed: Not Needed  Patient and/or Family Response: Mackenzie Allen reports older brother passed away approx one year and grieving his death is hard for herself and family. Mackenzie Allen reports she is being resilient for her mother but often feels like she can not be vulnerable. Mackenzie Allen also reports feeling anxious, on edge and worry a lot with current pregnancy.    Assessment: Patient currently experiencing anxiety.   Patient may benefit from integrated behavioral health.  Plan: Follow  up with behavioral health clinician on : next ob visit Behavioral recommendations: communicate need for added support, continue with journal writing and self care. Referral(s): Integrated [employer] (In Clinic) "From scale of 1-10, how likely are you to follow plan?":    Mackenzie Saxon, LCSW

## 2022-10-04 ENCOUNTER — Encounter: Payer: Self-pay | Admitting: *Deleted

## 2022-10-05 MED ORDER — METRONIDAZOLE 500 MG PO TABS
2000.0000 mg | ORAL_TABLET | Freq: Once | ORAL | 0 refills | Status: AC
Start: 2022-10-05 — End: 2022-10-05

## 2022-10-05 NOTE — Addendum Note (Signed)
Addended by: Adam Phenix on: 10/05/2022 03:13 PM   Modules accepted: Orders

## 2022-10-09 ENCOUNTER — Ambulatory Visit: Payer: Medicaid Other | Attending: Obstetrics

## 2022-10-09 DIAGNOSIS — Z3A24 24 weeks gestation of pregnancy: Secondary | ICD-10-CM

## 2022-10-09 DIAGNOSIS — O10919 Unspecified pre-existing hypertension complicating pregnancy, unspecified trimester: Secondary | ICD-10-CM | POA: Diagnosis present

## 2022-10-09 DIAGNOSIS — O98512 Other viral diseases complicating pregnancy, second trimester: Secondary | ICD-10-CM

## 2022-10-09 DIAGNOSIS — O10012 Pre-existing essential hypertension complicating pregnancy, second trimester: Secondary | ICD-10-CM | POA: Diagnosis not present

## 2022-10-09 DIAGNOSIS — B009 Herpesviral infection, unspecified: Secondary | ICD-10-CM

## 2022-10-10 ENCOUNTER — Other Ambulatory Visit: Payer: Self-pay | Admitting: *Deleted

## 2022-10-10 DIAGNOSIS — Z362 Encounter for other antenatal screening follow-up: Secondary | ICD-10-CM

## 2022-10-26 ENCOUNTER — Other Ambulatory Visit: Payer: Medicaid Other

## 2022-10-26 ENCOUNTER — Encounter: Payer: Self-pay | Admitting: Obstetrics and Gynecology

## 2022-10-26 ENCOUNTER — Ambulatory Visit (INDEPENDENT_AMBULATORY_CARE_PROVIDER_SITE_OTHER): Payer: Medicaid Other | Admitting: Obstetrics and Gynecology

## 2022-10-26 VITALS — BP 142/92 | HR 71 | Wt 153.0 lb

## 2022-10-26 DIAGNOSIS — O10912 Unspecified pre-existing hypertension complicating pregnancy, second trimester: Secondary | ICD-10-CM

## 2022-10-26 DIAGNOSIS — Z3A26 26 weeks gestation of pregnancy: Secondary | ICD-10-CM

## 2022-10-26 DIAGNOSIS — O23592 Infection of other part of genital tract in pregnancy, second trimester: Secondary | ICD-10-CM

## 2022-10-26 DIAGNOSIS — O10919 Unspecified pre-existing hypertension complicating pregnancy, unspecified trimester: Secondary | ICD-10-CM

## 2022-10-26 DIAGNOSIS — A5901 Trichomonal vulvovaginitis: Secondary | ICD-10-CM

## 2022-10-26 DIAGNOSIS — Z3480 Encounter for supervision of other normal pregnancy, unspecified trimester: Secondary | ICD-10-CM

## 2022-10-26 DIAGNOSIS — Z23 Encounter for immunization: Secondary | ICD-10-CM

## 2022-10-26 DIAGNOSIS — Z8619 Personal history of other infectious and parasitic diseases: Secondary | ICD-10-CM

## 2022-10-26 NOTE — Progress Notes (Signed)
Pt was recently treated for Trich. Pt finished tx last weekend.  Pt BP slightly elevated today, pt did not take Rx this morning due to GTT today.  Advised to take Rx once she is home.

## 2022-10-26 NOTE — Progress Notes (Signed)
Subjective:  Mackenzie Allen is a 27 y.o. G3P1011 at 100w4d being seen today for ongoing prenatal care.  She is currently monitored for the following issues for this high-risk pregnancy and has Chronic hypertension during pregnancy, antepartum; History of herpes genitalis; Trichomonal vaginitis in pregnancy; and Supervision of other normal pregnancy, antepartum on their problem list.  Patient reports  general discomforts of pregnancy .  Contractions: Not present. Vag. Bleeding: None.  Movement: Present. Denies leaking of fluid.   The following portions of the patient's history were reviewed and updated as appropriate: allergies, current medications, past family history, past medical history, past social history, past surgical history and problem list. Problem list updated.  Objective:   Vitals:   10/26/22 0828 10/26/22 0835  BP: (!) 132/92 (!) 142/92  Pulse: 77 71  Weight: 153 lb (69.4 kg)     Fetal Status: Fetal Heart Rate (bpm): 140   Movement: Present     General:  Alert, oriented and cooperative. Patient is in no acute distress.  Skin: Skin is warm and dry. No rash noted.   Cardiovascular: Normal heart rate noted  Respiratory: Normal respiratory effort, no problems with respiration noted  Abdomen: Soft, gravid, appropriate for gestational age. Pain/Pressure: Present     Pelvic:  Cervical exam deferred        Extremities: Normal range of motion.     Mental Status: Normal mood and affect. Normal behavior. Normal judgment and thought content.   Urinalysis:      Assessment and Plan:  Pregnancy: G3P1011 at [redacted]w[redacted]d  1. Supervision of other normal pregnancy, antepartum Stable - Glucose Tolerance, 2 Hours w/1 Hour - RPR - CBC - HIV antibody (with reflex) - Flu vaccine trivalent PF, 6mos and older(Flulaval,Afluria,Fluarix,Fluzone)  2. Chronic hypertension during pregnancy, antepartum BP stable  3. Trichomonal vaginitis during pregnancy in second trimester TOC with  next visit  4. History of herpes genitalis Suppression at 36 weeks  Preterm labor symptoms and general obstetric precautions including but not limited to vaginal bleeding, contractions, leaking of fluid and fetal movement were reviewed in detail with the patient. Please refer to After Visit Summary for other counseling recommendations.  No follow-ups on file.   Hermina Staggers, MD

## 2022-10-27 LAB — CBC
Hematocrit: 32.5 % — ABNORMAL LOW (ref 34.0–46.6)
Hemoglobin: 10.5 g/dL — ABNORMAL LOW (ref 11.1–15.9)
MCH: 30.8 pg (ref 26.6–33.0)
MCHC: 32.3 g/dL (ref 31.5–35.7)
MCV: 95 fL (ref 79–97)
Platelets: 206 x10E3/uL (ref 150–450)
RBC: 3.41 x10E6/uL — ABNORMAL LOW (ref 3.77–5.28)
RDW: 12.2 % (ref 11.7–15.4)
WBC: 6.9 x10E3/uL (ref 3.4–10.8)

## 2022-10-27 LAB — RPR: RPR Ser Ql: NONREACTIVE

## 2022-10-27 LAB — HIV ANTIBODY (ROUTINE TESTING W REFLEX): HIV Screen 4th Generation wRfx: NONREACTIVE

## 2022-10-27 LAB — GLUCOSE TOLERANCE, 2 HOURS W/ 1HR
Glucose, 1 hour: 110 mg/dL (ref 70–179)
Glucose, 2 hour: 83 mg/dL (ref 70–152)
Glucose, Fasting: 82 mg/dL (ref 70–91)

## 2022-11-08 ENCOUNTER — Ambulatory Visit: Payer: Medicaid Other | Attending: Maternal & Fetal Medicine

## 2022-11-08 ENCOUNTER — Other Ambulatory Visit: Payer: Self-pay | Admitting: *Deleted

## 2022-11-08 DIAGNOSIS — Z3689 Encounter for other specified antenatal screening: Secondary | ICD-10-CM

## 2022-11-08 DIAGNOSIS — Z362 Encounter for other antenatal screening follow-up: Secondary | ICD-10-CM | POA: Diagnosis present

## 2022-11-08 DIAGNOSIS — B009 Herpesviral infection, unspecified: Secondary | ICD-10-CM

## 2022-11-08 DIAGNOSIS — O10013 Pre-existing essential hypertension complicating pregnancy, third trimester: Secondary | ICD-10-CM

## 2022-11-08 DIAGNOSIS — Z3A28 28 weeks gestation of pregnancy: Secondary | ICD-10-CM | POA: Diagnosis not present

## 2022-11-08 DIAGNOSIS — O98513 Other viral diseases complicating pregnancy, third trimester: Secondary | ICD-10-CM

## 2022-11-08 DIAGNOSIS — O10913 Unspecified pre-existing hypertension complicating pregnancy, third trimester: Secondary | ICD-10-CM

## 2022-11-16 ENCOUNTER — Encounter: Payer: Medicaid Other | Admitting: Obstetrics and Gynecology

## 2022-11-23 ENCOUNTER — Ambulatory Visit: Payer: Medicaid Other | Admitting: Obstetrics and Gynecology

## 2022-11-23 VITALS — BP 124/81 | HR 98 | Wt 153.0 lb

## 2022-11-23 DIAGNOSIS — Z3A30 30 weeks gestation of pregnancy: Secondary | ICD-10-CM

## 2022-11-23 DIAGNOSIS — O23593 Infection of other part of genital tract in pregnancy, third trimester: Secondary | ICD-10-CM

## 2022-11-23 DIAGNOSIS — O10919 Unspecified pre-existing hypertension complicating pregnancy, unspecified trimester: Secondary | ICD-10-CM

## 2022-11-23 DIAGNOSIS — A5901 Trichomonal vulvovaginitis: Secondary | ICD-10-CM

## 2022-11-23 DIAGNOSIS — Z3480 Encounter for supervision of other normal pregnancy, unspecified trimester: Secondary | ICD-10-CM

## 2022-11-23 DIAGNOSIS — Z8619 Personal history of other infectious and parasitic diseases: Secondary | ICD-10-CM

## 2022-11-23 NOTE — Progress Notes (Signed)
   PRENATAL VISIT NOTE  Subjective:  Mackenzie Allen is a 27 y.o. G3P1011 at [redacted]w[redacted]d being seen today for ongoing prenatal care.  She is currently monitored for the following issues for this high-risk pregnancy and has Chronic hypertension during pregnancy, antepartum; History of herpes genitalis; Trichomonal vaginitis in pregnancy; and Supervision of other normal pregnancy, antepartum on their problem list.  Patient doing well with no acute concerns today. She reports no complaints.  Contractions: Not present. Vag. Bleeding: None.  Movement: Present. Denies leaking of fluid.   The following portions of the patient's history were reviewed and updated as appropriate: allergies, current medications, past family history, past medical history, past social history, past surgical history and problem list. Problem list updated.  Objective:   Vitals:   11/23/22 1331  BP: 124/81  Pulse: 98  Weight: 153 lb (69.4 kg)    Fetal Status: Fetal Heart Rate (bpm): 153   Movement: Present     General:  Alert, oriented and cooperative. Patient is in no acute distress.  Skin: Skin is warm and dry. No rash noted.   Cardiovascular: Normal heart rate noted  Respiratory: Normal respiratory effort, no problems with respiration noted  Abdomen: Soft, gravid, appropriate for gestational age.  Pain/Pressure: Present     Pelvic: Cervical exam deferred        Extremities: Normal range of motion.  Edema: None  Mental Status:  Normal mood and affect. Normal behavior. Normal judgment and thought content.   Assessment and Plan:  Pregnancy: G3P1011 at [redacted]w[redacted]d  1. Supervision of other normal pregnancy, antepartum Continue routine prenatal care  2. [redacted] weeks gestation of pregnancy   3. Chronic hypertension during pregnancy, antepartum Pt notes compliance with procardia and baby ASA Testing at 32 weeks, growth scan 11/20  4. History of herpes genitalis Prophylaxis at 36 weeks  5. Trichomonal  vaginitis during pregnancy in third trimester Pt took medication, but vomited an hour after the medication Pt will need test of cure at next visit  Preterm labor symptoms and general obstetric precautions including but not limited to vaginal bleeding, contractions, leaking of fluid and fetal movement were reviewed in detail with the patient.  Please refer to After Visit Summary for other counseling recommendations.   Return in about 2 weeks (around 12/07/2022) for [facility], in person.   Mariel Aloe, MD Faculty Attending Center for [facility]

## 2022-11-30 ENCOUNTER — Ambulatory Visit (INDEPENDENT_AMBULATORY_CARE_PROVIDER_SITE_OTHER): Payer: Medicaid Other | Admitting: Obstetrics and Gynecology

## 2022-11-30 ENCOUNTER — Other Ambulatory Visit (HOSPITAL_COMMUNITY)
Admission: RE | Admit: 2022-11-30 | Discharge: 2022-11-30 | Disposition: A | Discharge status: Home / Self Care | Payer: Medicaid Other | Source: Ambulatory Visit | Attending: Obstetrics and Gynecology | Admitting: Obstetrics and Gynecology

## 2022-11-30 VITALS — BP 108/69 | HR 85 | Wt 157.4 lb

## 2022-11-30 DIAGNOSIS — O23593 Infection of other part of genital tract in pregnancy, third trimester: Secondary | ICD-10-CM

## 2022-11-30 DIAGNOSIS — A5901 Trichomonal vulvovaginitis: Secondary | ICD-10-CM | POA: Insufficient documentation

## 2022-11-30 DIAGNOSIS — Z8619 Personal history of other infectious and parasitic diseases: Secondary | ICD-10-CM

## 2022-11-30 DIAGNOSIS — Z3480 Encounter for supervision of other normal pregnancy, unspecified trimester: Secondary | ICD-10-CM

## 2022-11-30 DIAGNOSIS — Z3A31 31 weeks gestation of pregnancy: Secondary | ICD-10-CM

## 2022-11-30 DIAGNOSIS — O10919 Unspecified pre-existing hypertension complicating pregnancy, unspecified trimester: Secondary | ICD-10-CM

## 2022-11-30 NOTE — Progress Notes (Signed)
   PRENATAL VISIT NOTE  Subjective:  Mackenzie Allen is a 27 y.o. G3P1011 at [redacted]w[redacted]d being seen today for ongoing prenatal care.  She is currently monitored for the following issues for this high-risk pregnancy and has Chronic hypertension during pregnancy, antepartum; History of herpes genitalis; Trichomonal vaginitis in pregnancy; and Supervision of other normal pregnancy, antepartum on their problem list.  Patient doing well with no acute concerns today. She reports no complaints.  Contractions: Not present. Vag. Bleeding: None.  Movement: Present. Denies leaking of fluid.   The following portions of the patient's history were reviewed and updated as appropriate: allergies, current medications, past family history, past medical history, past social history, past surgical history and problem list. Problem list updated.  Objective:   Vitals:   11/30/22 0953  BP: 108/69  Pulse: 85  Weight: 157 lb 6.4 oz (71.4 kg)    Fetal Status: Fetal Heart Rate (bpm): 154 Fundal Height: 30 cm Movement: Present     General:  Alert, oriented and cooperative. Patient is in no acute distress.  Skin: Skin is warm and dry. No rash noted.   Cardiovascular: Normal heart rate noted  Respiratory: Normal respiratory effort, no problems with respiration noted  Abdomen: Soft, gravid, appropriate for gestational age.  Pain/Pressure: Present     Pelvic: Cervical exam deferred        Extremities: Normal range of motion.  Edema: Trace  Mental Status:  Normal mood and affect. Normal behavior. Normal judgment and thought content.   Assessment and Plan:  Pregnancy: G3P1011 at [redacted]w[redacted]d  1. [redacted] weeks gestation of pregnancy   2. Chronic hypertension during pregnancy, antepartum Pt complaint with baby ASA and procardia, BP well controlled , fetal testing initiated  3. Trichomonal vaginitis during pregnancy in third trimester TOC today - Cervicovaginal ancillary only  4. History of herpes  genitalis Prophylaxis at 36 weeks  5. Supervision of other normal pregnancy, antepartum Continue routine prenatal care  Preterm labor symptoms and general obstetric precautions including but not limited to vaginal bleeding, contractions, leaking of fluid and fetal movement were reviewed in detail with the patient.  Please refer to After Visit Summary for other counseling recommendations.   Return in about 2 weeks (around 12/14/2022) for [facility], in person.   Mariel Aloe, MD Faculty Attending Center for [facility]

## 2022-11-30 NOTE — Progress Notes (Signed)
Pt reports fetal movement with some pressure. 

## 2022-12-01 LAB — CERVICOVAGINAL ANCILLARY ONLY
Bacterial Vaginitis (gardnerella): POSITIVE — AB
Candida Glabrata: NEGATIVE
Candida Vaginitis: POSITIVE — AB
Comment: NEGATIVE
Comment: NEGATIVE
Comment: NEGATIVE
Comment: NEGATIVE
Trichomonas: POSITIVE — AB

## 2022-12-04 ENCOUNTER — Other Ambulatory Visit: Payer: Self-pay

## 2022-12-04 DIAGNOSIS — B9689 Other specified bacterial agents as the cause of diseases classified elsewhere: Secondary | ICD-10-CM

## 2022-12-04 MED ORDER — METRONIDAZOLE 500 MG PO TABS: 500.0000 mg | ORAL_TABLET | Freq: Two times a day (BID) | ORAL | 0 refills | Status: DC

## 2022-12-05 ENCOUNTER — Other Ambulatory Visit: Payer: Self-pay | Admitting: *Deleted

## 2022-12-05 DIAGNOSIS — B3731 Acute candidiasis of vulva and vagina: Secondary | ICD-10-CM

## 2022-12-05 MED ORDER — TERCONAZOLE 0.8 % VA CREA: 1.0000 | TOPICAL_CREAM | Freq: Every day | VAGINAL | 0 refills | Status: DC

## 2022-12-05 NOTE — Progress Notes (Signed)
2nd TC. No answer. Left HIPAA compliant VM with call back number and indication that [facility] message would be sent. [facility] msg advising pt of infections, STI protocol, need for TOC and RX's sent. Education on each infection included in message.

## 2022-12-05 NOTE — Progress Notes (Signed)
RX terazol for yeast infection in pregnancy per protocol.

## 2022-12-06 ENCOUNTER — Ambulatory Visit: Payer: Medicaid Other | Attending: Obstetrics and Gynecology

## 2022-12-06 ENCOUNTER — Other Ambulatory Visit: Payer: Self-pay

## 2022-12-06 DIAGNOSIS — B009 Herpesviral infection, unspecified: Secondary | ICD-10-CM | POA: Diagnosis not present

## 2022-12-06 DIAGNOSIS — Z3689 Encounter for other specified antenatal screening: Secondary | ICD-10-CM | POA: Insufficient documentation

## 2022-12-06 DIAGNOSIS — O98513 Other viral diseases complicating pregnancy, third trimester: Secondary | ICD-10-CM | POA: Diagnosis not present

## 2022-12-06 DIAGNOSIS — O10913 Unspecified pre-existing hypertension complicating pregnancy, third trimester: Secondary | ICD-10-CM | POA: Insufficient documentation

## 2022-12-06 DIAGNOSIS — Z3A32 32 weeks gestation of pregnancy: Secondary | ICD-10-CM

## 2022-12-06 DIAGNOSIS — O10013 Pre-existing essential hypertension complicating pregnancy, third trimester: Secondary | ICD-10-CM | POA: Diagnosis not present

## 2022-12-12 ENCOUNTER — Telehealth: Payer: Self-pay

## 2022-12-12 ENCOUNTER — Ambulatory Visit (INDEPENDENT_AMBULATORY_CARE_PROVIDER_SITE_OTHER): Payer: Medicaid Other | Admitting: Obstetrics and Gynecology

## 2022-12-12 VITALS — BP 114/80 | HR 79 | Wt 162.0 lb

## 2022-12-12 DIAGNOSIS — A5901 Trichomonal vulvovaginitis: Secondary | ICD-10-CM

## 2022-12-12 DIAGNOSIS — Z8619 Personal history of other infectious and parasitic diseases: Secondary | ICD-10-CM

## 2022-12-12 DIAGNOSIS — O23593 Infection of other part of genital tract in pregnancy, third trimester: Secondary | ICD-10-CM

## 2022-12-12 DIAGNOSIS — O10919 Unspecified pre-existing hypertension complicating pregnancy, unspecified trimester: Secondary | ICD-10-CM

## 2022-12-12 DIAGNOSIS — Z3480 Encounter for supervision of other normal pregnancy, unspecified trimester: Secondary | ICD-10-CM

## 2022-12-12 DIAGNOSIS — Z3A33 33 weeks gestation of pregnancy: Secondary | ICD-10-CM

## 2022-12-12 NOTE — Progress Notes (Signed)
   PRENATAL VISIT NOTE  Subjective:  Mackenzie Allen is a 27 y.o. G3P1011 at [redacted]w[redacted]d being seen today for ongoing prenatal care.  She is currently monitored for the following issues for this high-risk pregnancy and has Chronic hypertension during pregnancy, antepartum; History of herpes genitalis; Trichomonal vaginitis in pregnancy; and Supervision of other normal pregnancy, antepartum on their problem list.  Patient doing well with no acute concerns today. She reports  occasional headaches .  Contractions: Not present. Vag. Bleeding: None.  Movement: Present. Denies leaking of fluid.   The following portions of the patient's history were reviewed and updated as appropriate: allergies, current medications, past family history, past medical history, past social history, past surgical history and problem list. Problem list updated.  Objective:   Vitals:   12/12/22 0948  BP: 114/80  Pulse: 79  Weight: 162 lb (73.5 kg)    Fetal Status: Fetal Heart Rate (bpm): 140 Fundal Height: 34 cm Movement: Present     General:  Alert, oriented and cooperative. Patient is in no acute distress.  Skin: Skin is warm and dry. No rash noted.   Cardiovascular: Normal heart rate noted  Respiratory: Normal respiratory effort, no problems with respiration noted  Abdomen: Soft, gravid, appropriate for gestational age.  Pain/Pressure: Present     Pelvic: Cervical exam deferred        Extremities: Normal range of motion.     Mental Status:  Normal mood and affect. Normal behavior. Normal judgment and thought content.   Assessment and Plan:  Pregnancy: G3P1011 at [redacted]w[redacted]d  1. [redacted] weeks gestation of pregnancy   2. Chronic hypertension during pregnancy, antepartum Pt compliant with procardia and baby ASA Continue weekly testing, delivery at 37-39 weeks  3. Trichomonal vaginitis during pregnancy in third trimester TOC at 35-36 weeks  4. History of herpes genitalis Prophylaxis at 36 weeks  5.  Supervision of other normal pregnancy, antepartum Continue routine prenatal care  Preterm labor symptoms and general obstetric precautions including but not limited to vaginal bleeding, contractions, leaking of fluid and fetal movement were reviewed in detail with the patient.  Please refer to After Visit Summary for other counseling recommendations.   Return in about 2 weeks (around 12/26/2022) for [facility], in person.   Mariel Aloe, MD Faculty Attending Center for [facility]

## 2022-12-12 NOTE — Progress Notes (Signed)
Pt complains of increase pelvic pressure and recent HA's. Not much relief with Tylenol use.

## 2022-12-12 NOTE — Telephone Encounter (Signed)
Left message for patient to call office - need to reschedule BPP - no need

## 2022-12-13 ENCOUNTER — Ambulatory Visit: Payer: Medicaid Other | Attending: Maternal & Fetal Medicine | Admitting: *Deleted

## 2022-12-13 ENCOUNTER — Ambulatory Visit: Payer: Medicaid Other

## 2022-12-13 ENCOUNTER — Other Ambulatory Visit: Payer: Self-pay

## 2022-12-13 VITALS — BP 116/76

## 2022-12-13 DIAGNOSIS — O10013 Pre-existing essential hypertension complicating pregnancy, third trimester: Secondary | ICD-10-CM

## 2022-12-13 DIAGNOSIS — Z3A33 33 weeks gestation of pregnancy: Secondary | ICD-10-CM | POA: Insufficient documentation

## 2022-12-13 DIAGNOSIS — O10913 Unspecified pre-existing hypertension complicating pregnancy, third trimester: Secondary | ICD-10-CM | POA: Insufficient documentation

## 2022-12-13 NOTE — Procedures (Signed)
Mackenzie Allen 1995/12/13 [redacted]w[redacted]d  Fetus A Non-Stress Test Interpretation for 12/13/22  Indication: Chronic Hypertenstion  Fetal Heart Rate A Mode: External Baseline Rate (A): 140 bpm Variability: Moderate Accelerations: 15 x 15 Decelerations: None Multiple birth?: No  Uterine Activity Mode: Palpation, Toco Contraction Frequency (min): 2 ucs Contraction Duration (sec): 50-120 Contraction Quality: Mild Resting Tone Palpated: Relaxed Resting Time: Adequate  Interpretation (Fetal Testing) Nonstress Test Interpretation: Reactive Overall Impression: Reassuring for gestational age Comments: Dr. Grace Bushy reviewed tracing

## 2022-12-20 ENCOUNTER — Ambulatory Visit: Payer: Medicaid Other | Attending: Obstetrics and Gynecology

## 2022-12-20 ENCOUNTER — Other Ambulatory Visit: Payer: Self-pay

## 2022-12-20 DIAGNOSIS — Z3689 Encounter for other specified antenatal screening: Secondary | ICD-10-CM | POA: Diagnosis present

## 2022-12-20 DIAGNOSIS — Z3A34 34 weeks gestation of pregnancy: Secondary | ICD-10-CM | POA: Diagnosis not present

## 2022-12-20 DIAGNOSIS — O10013 Pre-existing essential hypertension complicating pregnancy, third trimester: Secondary | ICD-10-CM

## 2022-12-20 DIAGNOSIS — O10913 Unspecified pre-existing hypertension complicating pregnancy, third trimester: Secondary | ICD-10-CM | POA: Diagnosis present

## 2022-12-26 ENCOUNTER — Ambulatory Visit (INDEPENDENT_AMBULATORY_CARE_PROVIDER_SITE_OTHER): Payer: Medicaid Other | Admitting: Obstetrics & Gynecology

## 2022-12-26 VITALS — BP 123/86 | HR 80 | Wt 160.1 lb

## 2022-12-26 DIAGNOSIS — Z3A35 35 weeks gestation of pregnancy: Secondary | ICD-10-CM

## 2022-12-26 DIAGNOSIS — O10013 Pre-existing essential hypertension complicating pregnancy, third trimester: Secondary | ICD-10-CM

## 2022-12-26 DIAGNOSIS — Z8619 Personal history of other infectious and parasitic diseases: Secondary | ICD-10-CM | POA: Diagnosis not present

## 2022-12-26 DIAGNOSIS — O10919 Unspecified pre-existing hypertension complicating pregnancy, unspecified trimester: Secondary | ICD-10-CM

## 2022-12-26 DIAGNOSIS — Z3480 Encounter for supervision of other normal pregnancy, unspecified trimester: Secondary | ICD-10-CM

## 2022-12-26 MED ORDER — VALACYCLOVIR HCL 500 MG PO TABS: 500.0000 mg | ORAL_TABLET | Freq: Two times a day (BID) | ORAL | 6 refills | Status: DC

## 2022-12-26 NOTE — Progress Notes (Signed)
   PRENATAL VISIT NOTE  Subjective:  Mackenzie Allen is a 27 y.o. G3P1011 at [redacted]w[redacted]d being seen today for ongoing prenatal care.  She is currently monitored for the following issues for this high-risk pregnancy and has Chronic hypertension during pregnancy, antepartum; History of herpes genitalis; Trichomonal vaginitis in pregnancy; and Supervision of other normal pregnancy, antepartum on their problem list.  Patient reports no complaints.  Contractions: Irregular. Vag. Bleeding: None.  Movement: Present. Denies leaking of fluid.   The following portions of the patient's history were reviewed and updated as appropriate: allergies, current medications, past family history, past medical history, past social history, past surgical history and problem list.   Objective:   Vitals:   12/26/22 0953  BP: 123/86  Pulse: 80  Weight: 160 lb 1.6 oz (72.6 kg)    Fetal Status:     Movement: Present     General:  Alert, oriented and cooperative. Patient is in no acute distress.  Skin: Skin is warm and dry. No rash noted.   Cardiovascular: Normal heart rate noted  Respiratory: Normal respiratory effort, no problems with respiration noted  Abdomen: Soft, gravid, appropriate for gestational age.  Pain/Pressure: Absent     Pelvic: Cervical exam deferred        Extremities: Normal range of motion.  Edema: Trace  Mental Status: Normal mood and affect. Normal behavior. Normal judgment and thought content.   Assessment and Plan:  Pregnancy: G3P1011 at [redacted]w[redacted]d 1. Chronic hypertension during pregnancy, antepartum Well controlled  2. Supervision of other normal pregnancy, antepartum   3. History of herpes genitalis Meds ordered this encounter  Medications   valACYclovir (VALTREX) 500 MG tablet    Sig: Take 1 tablet (500 mg total) by mouth 2 (two) times daily.    Dispense:  60 tablet    Refill:  6     Preterm labor symptoms and general obstetric precautions including but not limited  to vaginal bleeding, contractions, leaking of fluid and fetal movement were reviewed in detail with the patient. Please refer to After Visit Summary for other counseling recommendations.   Return in about 1 week (around 01/02/2023). GBS testing 1 week Future Appointments  Date Time Provider Department Center  12/27/2022  9:30 AM [facility] US3 [facility]  01/01/2023  9:35 AM Carlynn Herald, CNM [facility] None  01/03/2023  9:30 AM [facility] US3 [facility]  01/08/2023  9:35 AM Constant, Gigi Gin, MD [facility] None  01/15/2023  9:35 AM Constant, Gigi Gin, [place] [facility] None  01/22/2023  9:35 AM Leftwich-Kirby, Wilmer Floor, CNM [facility] None  01/29/2023  9:35 AM Constant, Gigi Gin, MD [facility] None    Scheryl Darter, MD

## 2022-12-27 ENCOUNTER — Other Ambulatory Visit: Payer: Self-pay

## 2022-12-27 ENCOUNTER — Ambulatory Visit: Payer: Medicaid Other | Attending: Obstetrics and Gynecology

## 2022-12-27 DIAGNOSIS — O10013 Pre-existing essential hypertension complicating pregnancy, third trimester: Secondary | ICD-10-CM | POA: Diagnosis not present

## 2022-12-27 DIAGNOSIS — Z3689 Encounter for other specified antenatal screening: Secondary | ICD-10-CM | POA: Diagnosis present

## 2022-12-27 DIAGNOSIS — Z3A35 35 weeks gestation of pregnancy: Secondary | ICD-10-CM | POA: Diagnosis not present

## 2022-12-27 DIAGNOSIS — O10913 Unspecified pre-existing hypertension complicating pregnancy, third trimester: Secondary | ICD-10-CM | POA: Diagnosis present

## 2023-01-01 ENCOUNTER — Other Ambulatory Visit: Payer: Self-pay | Admitting: Certified Nurse Midwife

## 2023-01-01 ENCOUNTER — Other Ambulatory Visit (HOSPITAL_COMMUNITY)
Admission: RE | Admit: 2023-01-01 | Discharge: 2023-01-01 | Disposition: A | Discharge status: Home / Self Care | Payer: Medicaid Other | Source: Ambulatory Visit | Attending: Certified Nurse Midwife | Admitting: Certified Nurse Midwife

## 2023-01-01 ENCOUNTER — Ambulatory Visit (INDEPENDENT_AMBULATORY_CARE_PROVIDER_SITE_OTHER): Payer: Medicaid Other | Admitting: Certified Nurse Midwife

## 2023-01-01 VITALS — BP 119/76 | HR 69 | Wt 161.4 lb

## 2023-01-01 DIAGNOSIS — B009 Herpesviral infection, unspecified: Secondary | ICD-10-CM

## 2023-01-01 DIAGNOSIS — O0993 Supervision of high risk pregnancy, unspecified, third trimester: Secondary | ICD-10-CM

## 2023-01-01 DIAGNOSIS — Z3480 Encounter for supervision of other normal pregnancy, unspecified trimester: Secondary | ICD-10-CM

## 2023-01-01 DIAGNOSIS — A599 Trichomoniasis, unspecified: Secondary | ICD-10-CM

## 2023-01-01 DIAGNOSIS — Z3A36 36 weeks gestation of pregnancy: Secondary | ICD-10-CM | POA: Diagnosis not present

## 2023-01-01 DIAGNOSIS — O10919 Unspecified pre-existing hypertension complicating pregnancy, unspecified trimester: Secondary | ICD-10-CM

## 2023-01-01 DIAGNOSIS — Z3493 Encounter for supervision of normal pregnancy, unspecified, third trimester: Secondary | ICD-10-CM | POA: Diagnosis present

## 2023-01-01 DIAGNOSIS — N760 Acute vaginitis: Secondary | ICD-10-CM

## 2023-01-01 DIAGNOSIS — B9689 Other specified bacterial agents as the cause of diseases classified elsewhere: Secondary | ICD-10-CM

## 2023-01-01 NOTE — Progress Notes (Signed)
Pt presents for ROB visit. No concerns.  

## 2023-01-01 NOTE — Progress Notes (Signed)
   PRENATAL VISIT NOTE  Subjective:  Mackenzie Allen is a 27 y.o. G3P1011 at [redacted]w[redacted]d being seen today for ongoing prenatal care.  She is currently monitored for the following issues for this high-risk pregnancy and has Chronic hypertension during pregnancy, antepartum; History of herpes genitalis; Trichomonal vaginitis in pregnancy; and Supervision of other normal pregnancy, antepartum on their problem list.  Patient reports  irregular braxton hicks. Notes they are a "few throughout the day" .  Contractions: Irritability. Vag. Bleeding: None.  Movement: Present. Denies leaking of fluid.   The following portions of the patient's history were reviewed and updated as appropriate: allergies, current medications, past family history, past medical history, past social history, past surgical history and problem list.   Objective:   Vitals:   01/01/23 0933  BP: 119/76  Pulse: 69  Weight: 161 lb 6.4 oz (73.2 kg)    Fetal Status: Fetal Heart Rate (bpm): 131 Fundal Height: 36 cm Movement: Present     General:  Alert, oriented and cooperative. Patient is in no acute distress.  Skin: Skin is warm and dry. No rash noted.   Cardiovascular: Normal heart rate noted  Respiratory: Normal respiratory effort, no problems with respiration noted  Abdomen: Soft, gravid, appropriate for gestational age.  Pain/Pressure: Present     Pelvic: Cervical exam deferred        Extremities: Normal range of motion.  Edema: Trace  Mental Status: Normal mood and affect. Normal behavior. Normal judgment and thought content.   Assessment and Plan:  Pregnancy: G3P1011 at [redacted]w[redacted]d 1. Supervision of high risk pregnancy in third trimester (Primary) - Patient doing well  - Reports vigorous and frequent fetal movement   2. [redacted] weeks gestation of pregnancy - Patient declined cervical exam.  - Culture, beta strep (group b only) - Cervicovaginal ancillary only( [place])  3. Chronic hypertension during  pregnancy, antepartum - Currently stable on Procardia.  - Reviewed MFM notes and recommended IOL between 38-39 weeks.  - Patient has emotional life stressors around the week of Christmas Eve and request IOL for the following week.  - IOL scheduled for 01/17/23  4. Trichomoniasis - GC collected today   5. HSV (herpes simplex virus) infection - Patient currently taking Valtrex for prophylaxis    Preterm labor symptoms and general obstetric precautions including but not limited to vaginal bleeding, contractions, leaking of fluid and fetal movement were reviewed in detail with the patient. Please refer to After Visit Summary for other counseling recommendations.   Return in about 1 week (around 01/08/2023) for HROB.  Future Appointments  Date Time Provider Department Center  01/03/2023  9:30 AM [facility] US[facility] [place]  01/08/2023  9:35 AM Constant, Gigi Gin, MD [facility] None  01/15/2023  9:35 AM Constant, Gigi Gin, MD [facility] None  01/22/2023  9:35 AM Leftwich-Kirby, Wilmer Floor, CNM [facility] None  01/29/2023  9:35 AM Constant, Gigi Gin, MD [facility] None    Waldine Zenz (Danella Deis) Suzie Portela, MSN, CNM  Center for [facility]  01/01/2023 10:39 AM

## 2023-01-01 NOTE — Progress Notes (Signed)
Orders for IOL for cHTN on Procardia placed today.   Roshawn Ayala Danella Deis) Suzie Portela, MSN, CNM  Center for [facility]  01/01/2023 11:26 AM

## 2023-01-02 LAB — CERVICOVAGINAL ANCILLARY ONLY
Bacterial Vaginitis (gardnerella): POSITIVE — AB
Candida Glabrata: NEGATIVE
Candida Vaginitis: POSITIVE — AB
Chlamydia: NEGATIVE
Comment: NEGATIVE
Comment: NEGATIVE
Comment: NEGATIVE
Comment: NEGATIVE
Comment: NEGATIVE
Comment: NORMAL
Neisseria Gonorrhea: NEGATIVE
Trichomonas: POSITIVE — AB

## 2023-01-03 ENCOUNTER — Other Ambulatory Visit: Payer: Self-pay

## 2023-01-03 ENCOUNTER — Ambulatory Visit: Payer: Medicaid Other | Attending: Obstetrics and Gynecology

## 2023-01-03 VITALS — BP 130/86 | HR 75

## 2023-01-03 DIAGNOSIS — Z3A36 36 weeks gestation of pregnancy: Secondary | ICD-10-CM | POA: Insufficient documentation

## 2023-01-03 DIAGNOSIS — Z3689 Encounter for other specified antenatal screening: Secondary | ICD-10-CM | POA: Diagnosis present

## 2023-01-03 DIAGNOSIS — Z3480 Encounter for supervision of other normal pregnancy, unspecified trimester: Secondary | ICD-10-CM | POA: Diagnosis present

## 2023-01-03 DIAGNOSIS — O10913 Unspecified pre-existing hypertension complicating pregnancy, third trimester: Secondary | ICD-10-CM | POA: Diagnosis present

## 2023-01-03 DIAGNOSIS — O10013 Pre-existing essential hypertension complicating pregnancy, third trimester: Secondary | ICD-10-CM | POA: Diagnosis not present

## 2023-01-03 MED ORDER — TINIDAZOLE 500 MG PO TABS: 2.0000 g | ORAL_TABLET | Freq: Once | ORAL | 1 refills | Status: AC

## 2023-01-03 MED ORDER — METRONIDAZOLE 500 MG PO TABS: 500.0000 mg | ORAL_TABLET | Freq: Two times a day (BID) | ORAL | 0 refills | Status: DC

## 2023-01-03 NOTE — Addendum Note (Signed)
Addended by: Carlynn Herald on: 01/03/2023 10:06 AM   Modules accepted: Orders

## 2023-01-04 LAB — CULTURE, BETA STREP (GROUP B ONLY): Strep Gp B Culture: POSITIVE — AB

## 2023-01-08 ENCOUNTER — Ambulatory Visit (INDEPENDENT_AMBULATORY_CARE_PROVIDER_SITE_OTHER): Payer: Medicaid Other | Admitting: Obstetrics and Gynecology

## 2023-01-08 ENCOUNTER — Encounter: Payer: Self-pay | Admitting: Obstetrics and Gynecology

## 2023-01-08 VITALS — BP 126/89 | HR 66 | Wt 166.6 lb

## 2023-01-08 DIAGNOSIS — Z3480 Encounter for supervision of other normal pregnancy, unspecified trimester: Secondary | ICD-10-CM

## 2023-01-08 DIAGNOSIS — O23593 Infection of other part of genital tract in pregnancy, third trimester: Secondary | ICD-10-CM

## 2023-01-08 DIAGNOSIS — Z8619 Personal history of other infectious and parasitic diseases: Secondary | ICD-10-CM

## 2023-01-08 DIAGNOSIS — O99820 Streptococcus B carrier state complicating pregnancy: Secondary | ICD-10-CM | POA: Insufficient documentation

## 2023-01-08 DIAGNOSIS — A5901 Trichomonal vulvovaginitis: Secondary | ICD-10-CM

## 2023-01-08 DIAGNOSIS — O10919 Unspecified pre-existing hypertension complicating pregnancy, unspecified trimester: Secondary | ICD-10-CM

## 2023-01-08 DIAGNOSIS — Z3A37 37 weeks gestation of pregnancy: Secondary | ICD-10-CM

## 2023-01-08 NOTE — Progress Notes (Signed)
   PRENATAL VISIT NOTE  Subjective:  Mackenzie Allen is a 27 y.o. G3P1011 at [redacted]w[redacted]d being seen today for ongoing prenatal care.  She is currently monitored for the following issues for this high-risk pregnancy and has Chronic hypertension during pregnancy, antepartum; History of herpes genitalis; Trichomonal vaginitis in pregnancy; Supervision of other normal pregnancy, antepartum; and GBS (group B Streptococcus carrier), +RV culture, currently pregnant on their problem list.  Patient reports no complaints.  Contractions: Irregular. Vag. Bleeding: None.  Movement: Present. Denies leaking of fluid.   The following portions of the patient's history were reviewed and updated as appropriate: allergies, current medications, past family history, past medical history, past social history, past surgical history and problem list.   Objective:   Vitals:   01/08/23 0937  BP: 126/89  Pulse: 66  Weight: 166 lb 9.6 oz (75.6 kg)    Fetal Status: Fetal Heart Rate (bpm): 143 (Simultaneous filing. User may not have seen previous data.) Fundal Height: 37 cm Movement: Present     General:  Alert, oriented and cooperative. Patient is in no acute distress.  Skin: Skin is warm and dry. No rash noted.   Cardiovascular: Normal heart rate noted  Respiratory: Normal respiratory effort, no problems with respiration noted  Abdomen: Soft, gravid, appropriate for gestational age.  Pain/Pressure: Present     Pelvic: Cervical exam deferred        Extremities: Normal range of motion.  Edema: Trace  Mental Status: Normal mood and affect. Normal behavior. Normal judgment and thought content.   Assessment and Plan:  Pregnancy: G3P1011 at [redacted]w[redacted]d 1. Supervision of other normal pregnancy, antepartum (Primary) Patient is doing well Informed of positive GBS and need for prophylaxis in labor  2. Chronic hypertension during pregnancy, antepartum BP stable on procardia Continue ASA No si/sx of  pre-eclampsia Scheduled for IOL on 01/17/23 at midnight Continue antenatal testing  3. History of herpes genitalis Continue suppression  4. Trichomonal vaginitis during pregnancy in third trimester TOC needed Rx provided on 12/18  Term labor symptoms and general obstetric precautions including but not limited to vaginal bleeding, contractions, leaking of fluid and fetal movement were reviewed in detail with the patient. Please refer to After Visit Summary for other counseling recommendations.   Return in about 1 week (around 01/15/2023) for in person, ROB, High risk.  Future Appointments  Date Time Provider Department Center  01/11/2023  9:30 AM [facility] NURSE [facility]  01/11/2023  9:45 AM [facility] NST [facility]  01/15/2023  9:35 AM Arber Wiemers, MD [facility] None  01/17/2023 12:00 AM [facility] SCHED ROOM [facility] None  01/22/2023  9:35 AM Leftwich-Kirby, Wilmer Floor, CNM [facility] None  01/29/2023  9:35 AM Xela Oregel, Gigi Gin, MD [facility] None    Catalina Antigua, MD

## 2023-01-11 ENCOUNTER — Telehealth (HOSPITAL_COMMUNITY): Payer: Self-pay | Admitting: *Deleted

## 2023-01-11 ENCOUNTER — Encounter (HOSPITAL_COMMUNITY): Payer: Self-pay | Admitting: *Deleted

## 2023-01-11 ENCOUNTER — Ambulatory Visit: Payer: Medicaid Other | Attending: Maternal & Fetal Medicine | Admitting: *Deleted

## 2023-01-11 ENCOUNTER — Other Ambulatory Visit: Payer: Self-pay

## 2023-01-11 ENCOUNTER — Encounter: Payer: Self-pay | Admitting: *Deleted

## 2023-01-11 ENCOUNTER — Ambulatory Visit: Payer: Medicaid Other | Admitting: *Deleted

## 2023-01-11 VITALS — BP 145/86 | HR 59

## 2023-01-11 VITALS — BP 127/85

## 2023-01-11 DIAGNOSIS — Z3A37 37 weeks gestation of pregnancy: Secondary | ICD-10-CM | POA: Insufficient documentation

## 2023-01-11 DIAGNOSIS — Z3480 Encounter for supervision of other normal pregnancy, unspecified trimester: Secondary | ICD-10-CM

## 2023-01-11 DIAGNOSIS — O10913 Unspecified pre-existing hypertension complicating pregnancy, third trimester: Secondary | ICD-10-CM | POA: Diagnosis present

## 2023-01-11 DIAGNOSIS — O99820 Streptococcus B carrier state complicating pregnancy: Secondary | ICD-10-CM

## 2023-01-11 NOTE — Telephone Encounter (Signed)
Preadmission screen  

## 2023-01-11 NOTE — Procedures (Signed)
Oral Mackenzie Allen Mar 16, 1995 [redacted]w[redacted]d  Fetus A Non-Stress Test Interpretation for 01/11/23-NST only  Indication: Chronic Hypertenstion  Fetal Heart Rate A Mode: External Baseline Rate (A): 125 bpm Variability: Moderate Accelerations: 15 x 15 Decelerations: None Multiple birth?: No  Uterine Activity Mode: Toco Contraction Frequency (min): occas Contraction Duration (sec): 40-50 Contraction Quality: Mild Resting Tone Palpated: Relaxed  Interpretation (Fetal Testing) Nonstress Test Interpretation: Reactive Comments: Tracing reviewed byDr. Parke Poisson

## 2023-01-15 ENCOUNTER — Encounter: Payer: Self-pay | Admitting: Obstetrics and Gynecology

## 2023-01-15 ENCOUNTER — Ambulatory Visit (INDEPENDENT_AMBULATORY_CARE_PROVIDER_SITE_OTHER): Payer: Medicaid Other | Admitting: Obstetrics and Gynecology

## 2023-01-15 VITALS — BP 128/88 | HR 75 | Wt 166.7 lb

## 2023-01-15 DIAGNOSIS — O10919 Unspecified pre-existing hypertension complicating pregnancy, unspecified trimester: Secondary | ICD-10-CM

## 2023-01-15 DIAGNOSIS — Z3A38 38 weeks gestation of pregnancy: Secondary | ICD-10-CM | POA: Diagnosis not present

## 2023-01-15 DIAGNOSIS — Z8619 Personal history of other infectious and parasitic diseases: Secondary | ICD-10-CM | POA: Diagnosis not present

## 2023-01-15 DIAGNOSIS — O10913 Unspecified pre-existing hypertension complicating pregnancy, third trimester: Secondary | ICD-10-CM | POA: Diagnosis not present

## 2023-01-15 DIAGNOSIS — O99820 Streptococcus B carrier state complicating pregnancy: Secondary | ICD-10-CM | POA: Diagnosis not present

## 2023-01-15 DIAGNOSIS — Z3480 Encounter for supervision of other normal pregnancy, unspecified trimester: Secondary | ICD-10-CM

## 2023-01-15 NOTE — Progress Notes (Signed)
Pt presents for ROB. Has induction scheduled for 1/1. No questions or concerns.

## 2023-01-15 NOTE — Progress Notes (Signed)
   PRENATAL VISIT NOTE  Subjective:  Mackenzie Allen is a 27 y.o. G3P1011 at [redacted]w[redacted]d being seen today for ongoing prenatal care.  She is currently monitored for the following issues for this high-risk pregnancy and has Chronic hypertension during pregnancy, antepartum; History of herpes genitalis; Trichomonal vaginitis in pregnancy; Supervision of other normal pregnancy, antepartum; and GBS (group B Streptococcus carrier), +RV culture, currently pregnant on their problem list.  Patient reports no complaints.  Contractions: Irritability. Vag. Bleeding: None.  Movement: Present. Denies leaking of fluid.   The following portions of the patient's history were reviewed and updated as appropriate: allergies, current medications, past family history, past medical history, past social history, past surgical history and problem list.   Objective:   Vitals:   01/15/23 0933  BP: 128/88  Pulse: 75  Weight: 166 lb 11.2 oz (75.6 kg)    Fetal Status: Fetal Heart Rate (bpm): 141 Fundal Height: 37 cm Movement: Present     General:  Alert, oriented and cooperative. Patient is in no acute distress.  Skin: Skin is warm and dry. No rash noted.   Cardiovascular: Normal heart rate noted  Respiratory: Normal respiratory effort, no problems with respiration noted  Abdomen: Soft, gravid, appropriate for gestational age.  Pain/Pressure: Absent     Pelvic: Cervical exam deferred        Extremities: Normal range of motion.  Edema: Trace  Mental Status: Normal mood and affect. Normal behavior. Normal judgment and thought content.   Assessment and Plan:  Pregnancy: G3P1011 at [redacted]w[redacted]d 1. Supervision of other normal pregnancy, antepartum (Primary) Patient is doing well without complaints  2. Chronic hypertension during pregnancy, antepartum Well controlled on procardia Continue ASA Scheduled for IOL tomorrow evening  3. GBS (group B Streptococcus carrier), +RV culture, currently  pregnant Prophylaxis in labor  4. History of herpes genitalis Continue suppression therapy  Term labor symptoms and general obstetric precautions including but not limited to vaginal bleeding, contractions, leaking of fluid and fetal movement were reviewed in detail with the patient. Please refer to After Visit Summary for other counseling recommendations.   Return in about 6 weeks (around 02/26/2023) for postpartum.  Future Appointments  Date Time Provider Department Center  01/17/2023 12:00 AM [facility] SCHED ROOM [facility] None  01/22/2023  9:35 AM Leftwich-Kirby, Wilmer Floor, CNM [facility] None  01/29/2023  9:35 AM Lisandra Mathisen, Gigi Gin, MD [facility] None    Catalina Antigua, MD

## 2023-01-17 ENCOUNTER — Encounter (HOSPITAL_COMMUNITY): Payer: Self-pay | Admitting: Obstetrics and Gynecology

## 2023-01-17 ENCOUNTER — Other Ambulatory Visit: Payer: Self-pay

## 2023-01-17 ENCOUNTER — Inpatient Hospital Stay (HOSPITAL_COMMUNITY)
Admission: RE | Admit: 2023-01-17 | Discharge: 2023-01-19 | DRG: 806 | Disposition: A | Discharge status: Home / Self Care | Payer: Medicaid Other | Attending: Family Medicine | Admitting: Family Medicine

## 2023-01-17 ENCOUNTER — Inpatient Hospital Stay (HOSPITAL_COMMUNITY): Payer: Medicaid Other

## 2023-01-17 DIAGNOSIS — O10919 Unspecified pre-existing hypertension complicating pregnancy, unspecified trimester: Secondary | ICD-10-CM | POA: Diagnosis present

## 2023-01-17 DIAGNOSIS — Z87891 Personal history of nicotine dependence: Secondary | ICD-10-CM | POA: Diagnosis not present

## 2023-01-17 DIAGNOSIS — Z8249 Family history of ischemic heart disease and other diseases of the circulatory system: Secondary | ICD-10-CM | POA: Diagnosis not present

## 2023-01-17 DIAGNOSIS — Z7982 Long term (current) use of aspirin: Secondary | ICD-10-CM | POA: Diagnosis not present

## 2023-01-17 DIAGNOSIS — O1092 Unspecified pre-existing hypertension complicating childbirth: Secondary | ICD-10-CM | POA: Diagnosis present

## 2023-01-17 DIAGNOSIS — O9832 Other infections with a predominantly sexual mode of transmission complicating childbirth: Secondary | ICD-10-CM | POA: Diagnosis present

## 2023-01-17 DIAGNOSIS — Z3490 Encounter for supervision of normal pregnancy, unspecified, unspecified trimester: Principal | ICD-10-CM | POA: Diagnosis present

## 2023-01-17 DIAGNOSIS — O133 Gestational [pregnancy-induced] hypertension without significant proteinuria, third trimester: Secondary | ICD-10-CM | POA: Diagnosis not present

## 2023-01-17 DIAGNOSIS — Z833 Family history of diabetes mellitus: Secondary | ICD-10-CM | POA: Diagnosis not present

## 2023-01-17 DIAGNOSIS — O99824 Streptococcus B carrier state complicating childbirth: Secondary | ICD-10-CM | POA: Diagnosis present

## 2023-01-17 DIAGNOSIS — Z23 Encounter for immunization: Secondary | ICD-10-CM | POA: Diagnosis not present

## 2023-01-17 DIAGNOSIS — O99820 Streptococcus B carrier state complicating pregnancy: Secondary | ICD-10-CM

## 2023-01-17 DIAGNOSIS — D62 Acute posthemorrhagic anemia: Secondary | ICD-10-CM | POA: Diagnosis not present

## 2023-01-17 DIAGNOSIS — Z3A38 38 weeks gestation of pregnancy: Secondary | ICD-10-CM

## 2023-01-17 DIAGNOSIS — A6000 Herpesviral infection of urogenital system, unspecified: Secondary | ICD-10-CM | POA: Diagnosis present

## 2023-01-17 DIAGNOSIS — Z8619 Personal history of other infectious and parasitic diseases: Secondary | ICD-10-CM | POA: Diagnosis present

## 2023-01-17 LAB — CBC
HCT: 32.7 % — ABNORMAL LOW (ref 36.0–46.0)
Hemoglobin: 11 g/dL — ABNORMAL LOW (ref 12.0–15.0)
MCH: 30.1 pg (ref 26.0–34.0)
MCHC: 33.6 g/dL (ref 30.0–36.0)
MCV: 89.6 fL (ref 80.0–100.0)
Platelets: 211 10*3/uL (ref 150–400)
RBC: 3.65 MIL/uL — ABNORMAL LOW (ref 3.87–5.11)
RDW: 14.1 % (ref 11.5–15.5)
WBC: 8.2 10*3/uL (ref 4.0–10.5)
nRBC: 0 % (ref 0.0–0.2)

## 2023-01-17 LAB — TYPE AND SCREEN
ABO/RH(D): O POS
Antibody Screen: NEGATIVE

## 2023-01-17 MED ORDER — LACTATED RINGERS IV SOLN
500.0000 mL | INTRAVENOUS | Status: DC | PRN
  Administered 2023-01-17: 250 mL via INTRAVENOUS

## 2023-01-17 MED ORDER — MISOPROSTOL 25 MCG QUARTER TABLET
25.0000 ug | ORAL_TABLET | Freq: Once | ORAL | Status: AC
  Administered 2023-01-17: 25 ug via VAGINAL
  Filled 2023-01-17: qty 1

## 2023-01-17 MED ORDER — TERBUTALINE SULFATE 1 MG/ML IJ SOLN: 0.2500 mg | Freq: Once | INTRAMUSCULAR | Status: DC | PRN

## 2023-01-17 MED ORDER — LIDOCAINE HCL (PF) 1 % IJ SOLN: 30.0000 mL | INTRAMUSCULAR | Status: DC | PRN

## 2023-01-17 MED ORDER — ACETAMINOPHEN 325 MG PO TABS: 650.0000 mg | ORAL_TABLET | ORAL | Status: DC | PRN

## 2023-01-17 MED ORDER — MISOPROSTOL 50MCG HALF TABLET
50.0000 ug | ORAL_TABLET | Freq: Once | ORAL | Status: AC
  Administered 2023-01-17: 50 ug via ORAL
  Filled 2023-01-17: qty 1

## 2023-01-17 MED ORDER — SODIUM CHLORIDE 0.9 % IV SOLN
5.0000 10*6.[IU] | Freq: Once | INTRAVENOUS | Status: AC
  Administered 2023-01-17: 5 10*6.[IU] via INTRAVENOUS
  Filled 2023-01-17: qty 5

## 2023-01-17 MED ORDER — NIFEDIPINE ER OSMOTIC RELEASE 30 MG PO TB24
30.0000 mg | ORAL_TABLET | Freq: Every day | ORAL | Status: DC
  Administered 2023-01-17 – 2023-01-19 (×3): 30 mg via ORAL
  Filled 2023-01-17 (×3): qty 1

## 2023-01-17 MED ORDER — PENICILLIN G POT IN DEXTROSE 60000 UNIT/ML IV SOLN
3.0000 10*6.[IU] | INTRAVENOUS | Status: DC
  Administered 2023-01-18 (×2): 3 10*6.[IU] via INTRAVENOUS
  Filled 2023-01-17 (×7): qty 50

## 2023-01-17 MED ORDER — LACTATED RINGERS IV SOLN
INTRAVENOUS | Status: DC
Start: 2023-01-17 — End: 2023-01-18

## 2023-01-17 MED ORDER — MISOPROSTOL 25 MCG QUARTER TABLET: 25.0000 ug | ORAL_TABLET | Freq: Once | ORAL | Status: DC

## 2023-01-17 MED ORDER — ONDANSETRON HCL 4 MG/2ML IJ SOLN
4.0000 mg | Freq: Four times a day (QID) | INTRAMUSCULAR | Status: DC | PRN
Start: 2023-01-17 — End: 2023-01-18
  Administered 2023-01-18: 4 mg via INTRAVENOUS
  Filled 2023-01-17: qty 2

## 2023-01-17 MED ORDER — SOD CITRATE-CITRIC ACID 500-334 MG/5ML PO SOLN: 30.0000 mL | ORAL | Status: DC | PRN

## 2023-01-17 MED ORDER — OXYTOCIN BOLUS FROM INFUSION: 333.0000 mL | Freq: Once | INTRAVENOUS | Status: DC

## 2023-01-17 MED ORDER — OXYCODONE-ACETAMINOPHEN 5-325 MG PO TABS: 2.0000 | ORAL_TABLET | ORAL | Status: DC | PRN

## 2023-01-17 MED ORDER — MISOPROSTOL 50MCG HALF TABLET: 50.0000 ug | ORAL_TABLET | Freq: Once | ORAL | Status: DC

## 2023-01-17 MED ORDER — OXYTOCIN-SODIUM CHLORIDE 30-0.9 UT/500ML-% IV SOLN: 2.5000 [IU]/h | INTRAVENOUS | Status: DC

## 2023-01-17 MED ORDER — OXYCODONE-ACETAMINOPHEN 5-325 MG PO TABS: 1.0000 | ORAL_TABLET | ORAL | Status: DC | PRN

## 2023-01-17 NOTE — L&D Delivery Note (Signed)
   Delivery Note:   G3P1011 at [redacted]w[redacted]d  Admitting diagnosis: Encounter for induction of labor [Z34.90] Risks:  Patient Active Problem List   Diagnosis Date Noted   Encounter for induction of labor 01/17/2023   GBS (group B Streptococcus carrier), +RV culture, currently pregnant 01/08/2023   Supervision of other normal pregnancy, antepartum 10/26/2022   Trichomonal vaginitis in pregnancy 06/01/2022   History of herpes genitalis 05/17/2020   Chronic hypertension during pregnancy, antepartum 01/18/2020     First Stage:  Induction of labor:01/17/23 Onset of labor: 01/18/2023 @ 0700 Augmentation: AROM, Pitocin , and Cytotec  ROM: AROM at 0956 Active labor onset: 0956 Analgesia /Anesthesia/Pain control intrapartum: Epidural  Second Stage:  Complete dilation at   1100 Onset of pushing at 1118 FHR second stage Cat II. Moderate variability accels present occasional variables with quick return to baseline during pushing    CNM called to patient bedside. Patient Pushing in lithotomy  position with CNM and L&D staff support at bedside. FOB and FOB's mother  present for birth and supportive.   Delivery of a Live born female  Birth Weight:  PENDING APGAR: 9, 9  Newborn Delivery   Birth date/time: 01/18/2023 11:29:00 Delivery type: Vaginal, Spontaneous    Fetal head delivered  in cephalic presentation, position DOA and spontaneously restituted to LOT position. Nuchal cord observed and easily reduced.  With great maternal pushing efforts remaining fetal body delivered with ease and vigorously crying infant placed immediately skin t skin with mom.  Nuchal Cord: Yes    After 3  mins of life cord double clamped after cessation of pulsation, and cut by FOB.  Collection of cord blood for typing completed. Cord blood donation-None Arterial cord blood sample-No   Third Stage:  With gentle cord traction and maternal pushing efforts. Placenta delivered-Spontaneous intact with 3 vessels. Uterine  tone firm bleeding minimal Uterotonics: IV pit bolus initiated  Placenta to L&D for disposal.  1st degree laceration identified.  Episiotomy:None Local analgesia: N/A   Repair: Bilateral Periurethral tears identified. Left one repaired to hemostasis with a 4.0 Monocryl.  Est. Blood Loss (mL):409  Complications: None   Mom to postpartum.  Baby girl Sanii to Couplet care / Skin to Skin.  Delivery Report:  Review the Delivery Report for details.    Draper Gallon Erven) Emilio, MSN, CNM  Center for [facility]  01/18/23  12:00 PM

## 2023-01-17 NOTE — H&P (Addendum)
 Mackenzie Allen is a 28 y.o. G30P1011 female at 100w3d by by LMP c/w 20wk u/s presenting for IOL due to cHTN.   Reports active fetal movement, contractions: irreg, mild; vaginal bleeding: none, membranes: intact.  Initiated prenatal care at [facility] at 13.4 wks.   Most recent u/s : [redacted]w[redacted]d, EFW 20%, AFI 7.48cm, cephalic, ant placenta.   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   Prenatal History/Complications:  # vag delivery 2022  Past Medical History: Past Medical History:  Diagnosis Date   Herpes genitalia    Hypertension    Medical history non-contributory    Supervision of other normal pregnancy, antepartum 07/27/2022              NURSING     PROVIDER      Office Location    [facility]    Dating by           [facility] Model    Traditional    Anatomy U/S           Initiated care at     13wks                           Language     English                     LAB RESULTS       Support Person         Genetics    NIPS: low risk female  AFP:                 NT/IT (FT only)                     Carrier Screen    Horizon:       Rho    Past Surgical History: Past Surgical History:  Procedure Laterality Date   WRIST SURGERY Left     Obstetrical History: OB History     Gravida  3   Para  1   Term  1   Preterm      AB  1   Living  1      SAB  1   IAB      Ectopic      Multiple  0   Live Births  1           Social History: Social History   Socioeconomic History   Marital status: Single    Spouse name: Not on file   Number of children: Not on file   Years of education: Not on file   Highest education level: Not on file  Occupational History   Occupation: [employer]  Tobacco Use   Smoking status: Former    Types: Cigars   Smokeless tobacco: Never   Tobacco comments:    1 Cigar a day   Vaping Use   Vaping status: Never Used  Substance and Sexual Activity   Alcohol use: Not Currently   Drug use:  Not Currently    Types: Marijuana    Comment: not since becoming pregnant   Sexual activity: Yes  Other Topics Concern   Not on file  Social History Narrative   Not on file   Social Drivers of Health   Financial Resource Strain: Not on file  Food Insecurity: No Food Insecurity (01/17/2023)   Hunger Vital Sign  Worried About Programme Researcher, Broadcasting/film/video in the Last Year: Never true    Ran Out of Food in the Last Year: Never true  Transportation Needs: No Transportation Needs (01/17/2023)   PRAPARE - Administrator, Civil Service (Medical): No    Lack of Transportation (Non-Medical): No  Physical Activity: Not on file  Stress: Not on file  Social Connections: Unknown (05/31/2021)   [facility]   Social Network    Social Network: Not on file    Family History: Family History  Problem Relation Age of Onset   Asthma Mother    Varicose Veins Mother    Asthma Brother    Asthma Maternal Grandmother    Diabetes Maternal Grandmother    Heart disease Maternal Grandmother    Hypertension Maternal Grandmother    Arthritis Paternal Grandmother     Allergies: No Known Allergies  Medications Prior to Admission  Medication Sig Dispense Refill Last Dose/Taking   aspirin  81 MG chewable tablet Chew 1 tablet (81 mg total) by mouth daily. Start at 15 weeks 30 tablet 5    metroNIDAZOLE  (FLAGYL ) 500 MG tablet Take 1 tablet (500 mg total) by mouth 2 (two) times daily. (Patient not taking: Reported on 01/15/2023) 14 tablet 0    NIFEdipine  (PROCARDIA -XL/NIFEDICAL-XL) 30 MG 24 hr tablet Take 1 tablet (30 mg total) by mouth daily. 30 tablet 11    Prenatal Vit-Fe Fumarate-FA (MULTIVITAMIN-PRENATAL) 27-0.8 MG TABS tablet Take 1 tablet by mouth daily at 12 noon.      promethazine  (PHENERGAN ) 25 MG tablet Take 1 tablet (25 mg total) by mouth every 6 (six) hours as needed for nausea or vomiting. (Patient not taking: Reported on 01/01/2023) 30 tablet 2    scopolamine   (TRANSDERM-SCOP) 1 MG/3DAYS Place 1 patch (1.5 mg total) onto the skin every 3 (three) days. (Patient not taking: Reported on 11/30/2022) 10 patch 12    terconazole  (TERAZOL 3 ) 0.8 % vaginal cream Place 1 applicator vaginally at bedtime. Apply nightly for three nights. (Patient not taking: Reported on 12/26/2022) 20 g 0    valACYclovir  (VALTREX ) 500 MG tablet Take 1 tablet (500 mg total) by mouth 2 (two) times daily. 60 tablet 6     Review of Systems  Pertinent pos/neg as indicated in HPI  Blood pressure (!) 140/85, pulse 86, temperature 98.7 F (37.1 C), temperature source Oral, resp. rate 20, height 5' 7 (1.702 m), weight 76.6 kg, last menstrual period 04/23/2022, SpO2 99%. General appearance: alert, cooperative, and no distress Lungs: clear to auscultation bilaterally Heart: regular rate and rhythm Abdomen: gravid, soft, non-tender, EFW by Leopold's approximately 6lbs Extremities: tr edema  Fetal monitoring: FHR: 140s bpm, variability: moderate,  Accelerations: Present,  decelerations:  Absent Uterine activity: irreg, mild Dilation: 1 Effacement (%): 50 Station: -3 Exam by:: CNM Luke Gentry (no s/s HSV outbreak) Presentation: cephalic   Prenatal labs: ABO, Rh: --/--/O POS (01/01 2100) Antibody: NEG (01/01 2100) Rubella: 2.76 (07/11 1520) RPR: Non Reactive (10/10 0816)  HBsAg: Negative (07/11 1520)  HIV: Non Reactive (10/10 0816)  GBS: Positive/-- (12/16 1019)  2hr GTT: 82/110/83  Prenatal Transfer Tool  Maternal Diabetes: No Genetic Screening: Normal Maternal Ultrasounds/Referrals: Normal Fetal Ultrasounds or other Referrals:  None Maternal Substance Abuse:  No Significant Maternal Medications:  Meds include: Other: Valtrex , Procardia  Significant Maternal Lab Results: Group B Strep positive  Results for orders placed or performed during the hospital encounter of 01/17/23 (from the past 24 hours)  Type  and screen [facility]   Collection Time: 01/17/23   9:00 PM  Result Value Ref Range   ABO/RH(D) O POS    Antibody Screen NEG    Sample Expiration      01/20/2023,2359 Performed at [facility], [address] [place], [place] [address]   CBC   Collection Time: 01/17/23  9:01 PM  Result Value Ref Range   WBC 8.2 4.0 - 10.5 K/uL   RBC 3.65 (L) 3.87 - 5.11 MIL/uL   Hemoglobin 11.0 (L) 12.0 - 15.0 g/dL   HCT 67.2 (L) 63.9 - 53.9 %   MCV 89.6 80.0 - 100.0 fL   MCH 30.1 26.0 - 34.0 pg   MCHC 33.6 30.0 - 36.0 g/dL   RDW 85.8 88.4 - 84.4 %   Platelets 211 150 - 400 K/uL   nRBC 0.0 0.0 - 0.2 %     Assessment:  [redacted]w[redacted]d SIUP  G3P1011  cHTN  Cat 1 FHR  GBS Positive/-- (12/16 1019)  Plan:  Admit to L&D  IV pain meds/epidural prn active labor  Dual dose cytotec  for ripening to start; would like to avoid cervical foley if possible but not completely opposed to it  PCN for GBS ppx  Continue home Procardia  XL 30mg  dosing  Anticipate vag delivery   Plans to breastfeed  Contraception: possibly IUD, outpt   [name] CNM 01/17/2023, 10:30 PM

## 2023-01-18 ENCOUNTER — Inpatient Hospital Stay (HOSPITAL_COMMUNITY): Payer: Medicaid Other | Admitting: Anesthesiology

## 2023-01-18 ENCOUNTER — Encounter (HOSPITAL_COMMUNITY): Payer: Self-pay | Admitting: Family Medicine

## 2023-01-18 DIAGNOSIS — O99820 Streptococcus B carrier state complicating pregnancy: Secondary | ICD-10-CM

## 2023-01-18 DIAGNOSIS — O133 Gestational [pregnancy-induced] hypertension without significant proteinuria, third trimester: Secondary | ICD-10-CM

## 2023-01-18 DIAGNOSIS — Z3A38 38 weeks gestation of pregnancy: Secondary | ICD-10-CM

## 2023-01-18 LAB — RPR: RPR Ser Ql: NONREACTIVE

## 2023-01-18 MED ORDER — IBUPROFEN 600 MG PO TABS
600.0000 mg | ORAL_TABLET | Freq: Four times a day (QID) | ORAL | Status: DC
  Administered 2023-01-18 – 2023-01-19 (×5): 600 mg via ORAL
  Filled 2023-01-18 (×5): qty 1

## 2023-01-18 MED ORDER — DIPHENHYDRAMINE HCL 50 MG/ML IJ SOLN: 12.5000 mg | INTRAMUSCULAR | Status: DC | PRN

## 2023-01-18 MED ORDER — EPHEDRINE 5 MG/ML INJ: 10.0000 mg | INTRAVENOUS | Status: DC | PRN

## 2023-01-18 MED ORDER — FENTANYL CITRATE (PF) 100 MCG/2ML IJ SOLN
50.0000 ug | INTRAMUSCULAR | Status: DC | PRN
  Administered 2023-01-18: 100 ug via INTRAVENOUS
  Administered 2023-01-18: 50 ug via INTRAVENOUS
  Filled 2023-01-18 (×2): qty 2

## 2023-01-18 MED ORDER — BENZOCAINE-MENTHOL 20-0.5 % EX AERO: 1.0000 | INHALATION_SPRAY | CUTANEOUS | Status: DC | PRN

## 2023-01-18 MED ORDER — OXYCODONE HCL 5 MG PO TABS
10.0000 mg | ORAL_TABLET | ORAL | Status: DC | PRN
Start: 2023-01-18 — End: 2023-01-19

## 2023-01-18 MED ORDER — DIBUCAINE (PERIANAL) 1 % EX OINT: 1.0000 | TOPICAL_OINTMENT | CUTANEOUS | Status: DC | PRN

## 2023-01-18 MED ORDER — FENTANYL-BUPIVACAINE-NACL 0.5-0.125-0.9 MG/250ML-% EP SOLN
12.0000 mL/h | EPIDURAL | Status: DC | PRN
  Administered 2023-01-18: 12 mL/h via EPIDURAL
  Filled 2023-01-18: qty 250

## 2023-01-18 MED ORDER — TERBUTALINE SULFATE 1 MG/ML IJ SOLN: 0.2500 mg | Freq: Once | INTRAMUSCULAR | Status: DC | PRN

## 2023-01-18 MED ORDER — FENTANYL-BUPIVACAINE-NACL 0.5-0.125-0.9 MG/250ML-% EP SOLN: 12.0000 mL/h | EPIDURAL | Status: DC | PRN

## 2023-01-18 MED ORDER — ACETAMINOPHEN 325 MG PO TABS: 650.0000 mg | ORAL_TABLET | ORAL | Status: DC | PRN

## 2023-01-18 MED ORDER — DIPHENHYDRAMINE HCL 25 MG PO CAPS: 25.0000 mg | ORAL_CAPSULE | Freq: Four times a day (QID) | ORAL | Status: DC | PRN

## 2023-01-18 MED ORDER — ONDANSETRON HCL 4 MG PO TABS: 4.0000 mg | ORAL_TABLET | ORAL | Status: DC | PRN

## 2023-01-18 MED ORDER — SENNOSIDES-DOCUSATE SODIUM 8.6-50 MG PO TABS
2.0000 | ORAL_TABLET | Freq: Every day | ORAL | Status: DC
  Administered 2023-01-19: 2 via ORAL
  Filled 2023-01-18: qty 2

## 2023-01-18 MED ORDER — LACTATED RINGERS IV SOLN: 500.0000 mL | Freq: Once | INTRAVENOUS | Status: DC

## 2023-01-18 MED ORDER — TETANUS-DIPHTH-ACELL PERTUSSIS 5-2.5-18.5 LF-MCG/0.5 IM SUSY
0.5000 mL | PREFILLED_SYRINGE | Freq: Once | INTRAMUSCULAR | Status: AC
  Administered 2023-01-19: 0.5 mL via INTRAMUSCULAR
  Filled 2023-01-18: qty 0.5

## 2023-01-18 MED ORDER — COCONUT OIL OIL: 1.0000 | TOPICAL_OIL | Status: DC | PRN

## 2023-01-18 MED ORDER — PRENATAL MULTIVITAMIN CH
1.0000 | ORAL_TABLET | Freq: Every day | ORAL | Status: DC
  Administered 2023-01-18 – 2023-01-19 (×2): 1 via ORAL
  Filled 2023-01-18 (×2): qty 1

## 2023-01-18 MED ORDER — PHENYLEPHRINE 80 MCG/ML (10ML) SYRINGE FOR IV PUSH (FOR BLOOD PRESSURE SUPPORT): 80.0000 ug | PREFILLED_SYRINGE | INTRAVENOUS | Status: DC | PRN

## 2023-01-18 MED ORDER — ZOLPIDEM TARTRATE 5 MG PO TABS: 5.0000 mg | ORAL_TABLET | Freq: Every evening | ORAL | Status: DC | PRN

## 2023-01-18 MED ORDER — WITCH HAZEL-GLYCERIN EX PADS: 1.0000 | MEDICATED_PAD | CUTANEOUS | Status: DC | PRN

## 2023-01-18 MED ORDER — ONDANSETRON HCL 4 MG/2ML IJ SOLN: 4.0000 mg | INTRAMUSCULAR | Status: DC | PRN

## 2023-01-18 MED ORDER — LIDOCAINE HCL (PF) 1 % IJ SOLN
INTRAMUSCULAR | Status: DC | PRN
  Administered 2023-01-18: 8 mL via EPIDURAL

## 2023-01-18 MED ORDER — SIMETHICONE 80 MG PO CHEW: 80.0000 mg | CHEWABLE_TABLET | ORAL | Status: DC | PRN

## 2023-01-18 MED ORDER — OXYCODONE HCL 5 MG PO TABS: 5.0000 mg | ORAL_TABLET | ORAL | Status: DC | PRN

## 2023-01-18 MED ORDER — OXYTOCIN-SODIUM CHLORIDE 30-0.9 UT/500ML-% IV SOLN
1.0000 m[IU]/min | INTRAVENOUS | Status: DC
  Administered 2023-01-18: 2 m[IU]/min via INTRAVENOUS
  Filled 2023-01-18: qty 500

## 2023-01-18 NOTE — Discharge Summary (Signed)
 Postpartum Discharge Summary     Patient Name: Mackenzie Allen DOB: [birth year] MRN: [id number]  Date of admission: 01/17/2023 Delivery date:01/18/2023 Delivering provider: PAYNE, SHANTONETTE M Date of discharge: 01/19/2023  Admitting diagnosis: Encounter for induction of labor [Z34.90] Intrauterine pregnancy: [redacted]w[redacted]d     Secondary diagnosis:  Principal Problem:   Encounter for induction of labor Active Problems:   Chronic hypertension during pregnancy, antepartum   History of herpes genitalis   GBS (group B Streptococcus carrier), +RV culture, currently pregnant   Vaginal delivery   Acute blood loss anemia  Additional problems:  Patient Active Problem List   Diagnosis Date Noted   Vaginal delivery 01/19/2023   Acute blood loss anemia 01/19/2023   Encounter for induction of labor 01/17/2023   GBS (group B Streptococcus carrier), +RV culture, currently pregnant 01/08/2023   Supervision of other normal pregnancy, antepartum 10/26/2022   Trichomonal vaginitis in pregnancy 06/01/2022   History of herpes genitalis 05/17/2020   Chronic hypertension during pregnancy, antepartum 01/18/2020       Discharge diagnosis: CHTN                                              Post partum procedures: None Augmentation: AROM, Pitocin , and Cytotec  Complications: None  Hospital course: Induction of Labor With Vaginal Delivery   28 y.o. yo [id number] at [redacted]w[redacted]d was admitted to the hospital 01/17/2023 for induction of labor.  Indication for induction:  cHTN .  Patient had an labor course complicated by none Membrane Rupture Time/Date: 9:56 AM,01/18/2023  Delivery Method:Vaginal, Spontaneous Operative Delivery:N/A Episiotomy: None Lacerations:  1st degree Details of delivery can be found in separate delivery note.  Patient had a postpartum course complicated by nothing. Started on Lasix  for CHTN.  Patient is discharged home 01/19/23.  Newborn Data: Birth date:01/18/2023 Birth time:11:29  AM Gender:Female Living status:Living Apgars:9 ,9  Weight:3260 g  Magnesium Sulfate received: No BMZ received: No Rhophylac:N/A MMR:N/A T-DaP: declined Flu: Yes RSV Vaccine received: No Transfusion:No  Immunizations received: Immunization History  Administered Date(s) Administered   Influenza, Seasonal, Injecte, Preservative Fre 10/26/2022   Influenza,inj,Quad PF,6+ Mos 02/23/2020   Tdap 06/25/2020    Physical exam  Vitals:   01/18/23 1805 01/18/23 2313 01/19/23 0327 01/19/23 0958  BP: 129/77 128/85 122/79 (!) 136/92  Pulse: 74 67 78 82  Resp: 18 18 18    Temp: 98.2 F (36.8 C) 98.5 F (36.9 C) 98.1 F (36.7 C)   TempSrc: Oral Oral Oral   SpO2: 100% 100% 100% 100%  Weight:      Height:       General: alert, cooperative, and no distress Lochia: appropriate Uterine Fundus: firm Incision: N/A DVT Evaluation: No evidence of DVT seen on physical exam. Labs: Lab Results  Component Value Date   WBC 7.8 01/19/2023   HGB 9.4 (L) 01/19/2023   HCT 28.2 (L) 01/19/2023   MCV 90.7 01/19/2023   PLT 177 01/19/2023      Latest Ref Rng & Units 01/19/2023    9:12 AM  CMP  Glucose 70 - 99 mg/dL 80   BUN 6 - 20 mg/dL 8   Creatinine 9.55 - 8.99 mg/dL 9.29   Sodium 864 - 854 mmol/L 136   Potassium 3.5 - 5.1 mmol/L 4.2   Chloride 98 - 111 mmol/L 106   CO2 22 - 32 mmol/L 23  Calcium 8.9 - 10.3 mg/dL 8.9   Total Protein 6.5 - 8.1 g/dL 5.4   Total Bilirubin 0.0 - 1.2 mg/dL 0.3   Alkaline Phos 38 - 126 U/L 141   AST 15 - 41 U/L 20   ALT 0 - 44 U/L 17    Edinburgh Score:    01/18/2023    2:05 PM  Edinburgh Postnatal Depression Scale Screening Tool  I have been able to laugh and see the funny side of things. 0  I have looked forward with enjoyment to things. 0  I have blamed myself unnecessarily when things went wrong. 0  I have been anxious or worried for no good reason. 0  I have felt scared or panicky for no good reason. 0  Things have been getting on top of me. 0  I  have been so unhappy that I have had difficulty sleeping. 0  I have felt sad or miserable. 0  I have been so unhappy that I have been crying. 0  The thought of harming myself has occurred to me. 0  Edinburgh Postnatal Depression Scale Total 0   Edinburgh Postnatal Depression Scale Total: 0   After visit meds:  Allergies as of 01/19/2023   No Known Allergies      Medication List     STOP taking these medications    aspirin  81 MG chewable tablet   metroNIDAZOLE  500 MG tablet Commonly known as: FLAGYL    promethazine  25 MG tablet Commonly known as: PHENERGAN    scopolamine  1 MG/3DAYS Commonly known as: TRANSDERM-SCOP   terconazole  0.8 % vaginal cream Commonly known as: TERAZOL 3        TAKE these medications    acetaminophen  500 MG tablet Commonly known as: TYLENOL  Take 2 tablets (1,000 mg total) by mouth every 6 (six) hours as needed for mild pain (pain score 1-3) (for pain scale < 4).   ferrous sulfate  325 (65 FE) MG EC tablet Take 1 tablet (325 mg total) by mouth every other day.   furosemide  20 MG tablet Commonly known as: LASIX  Take 1 tablet (20 mg total) by mouth daily. Start taking on: January 20, 2023   ibuprofen  600 MG tablet Commonly known as: ADVIL  Take 1 tablet (600 mg total) by mouth every 6 (six) hours.   multivitamin-prenatal 27-0.8 MG Tabs tablet Take 1 tablet by mouth daily at 12 noon.   NIFEdipine  30 MG 24 hr tablet Commonly known as: PROCARDIA -XL/NIFEDICAL-XL Take 1 tablet (30 mg total) by mouth daily.   valACYclovir  500 MG tablet Commonly known as: VALTREX  Take 1 tablet (500 mg total) by mouth 2 (two) times daily.         Discharge home in stable condition Infant Feeding: Breast Infant Disposition:home with mother Discharge instruction: per After Visit Summary and Postpartum booklet. Activity: Advance as tolerated. Pelvic rest for 6 weeks.  Diet: iron rich diet Future Appointments: Future Appointments  Date Time Provider  Department Center  01/26/2023 10:40 AM [facility] NURSE [facility] None  02/21/2023  3:10 PM Delores Nidia CROME, FNP [facility] None   Follow up Visit:  Follow-up Information     [facility] Follow up.   Specialty: Obstetrics and Gynecology Why: Blood pressure check 01/26/23 and postparum appointment 4-6 weeks Contact information: [address] [place]  [address] [phone]        [facility] Follow up.   Specialty: Obstetrics and Gynecology Why: As needed in emergencies  Contact information: [address] [place]  [phone]                 Please schedule this patient for a In person postpartum visit in 4 weeks with the following provider: Any provider. Additional Postpartum F/U:BP check 1 week  High risk pregnancy complicated by: HTN Delivery mode:  Vaginal, Spontaneous Anticipated Birth Control:  IUD OP   01/19/2023 Timmy Bubeck  Claudene, CNM  Message sent on 01/18/23 by S. Emilio cnm

## 2023-01-18 NOTE — Anesthesia Postprocedure Evaluation (Signed)
 Anesthesia Post Note  Patient: Mackenzie Allen  Procedure(s) Performed: AN AD HOC LABOR EPIDURAL     Patient location during evaluation: Mother Baby Anesthesia Type: Epidural Level of consciousness: awake and alert Pain management: pain level controlled Vital Signs Assessment: post-procedure vital signs reviewed and stable Respiratory status: spontaneous breathing, nonlabored ventilation and respiratory function stable Cardiovascular status: stable Postop Assessment: no headache, no backache and epidural receding Anesthetic complications: no   No notable events documented.  Last Vitals:  Vitals:   01/18/23 1302 01/18/23 1405  BP: (!) 127/95 (!) 125/95  Pulse: 79 81  Resp: 16   Temp: 36.6 C   SpO2: 100%     Last Pain:  Vitals:   01/18/23 1700  TempSrc:   PainSc: 0-No pain                 Gwendola Hornaday

## 2023-01-18 NOTE — Lactation Note (Signed)
 This note was copied from a baby's chart. Lactation Consultation Note  Patient Name: Mackenzie Allen Date: 01/18/2023 Age:27 hours Reason for consult: Initial assessment;Early term 37-38.6wks.  P2, ETI female infant. Per [name], infant has been BF well this is her third latch. [name] latched infant on her right breast using  pillow support and the cross cradle hold, infant sustained latch and BF for 13 minutes. [name] knows if infant is still cuing after offer the 1st breast she can offer the 2nd breast during the same feeding. [name] knows to call for further latch assistance if needed. LC discussed infant's input and output, Per [name], infant had 2 voids and one stool since birth. [name] goal is to BF her 2nd child longer. [name] will continue to BF infant by cues, on demand, every 2-3 hours, skin to skin. LC discussed importance of maternal rest, balance meals and snacks.   Maternal Data Has patient been taught Hand Expression?: Yes Does the patient have breastfeeding experience prior to this delivery?: Yes How long did the patient breastfeed?: Per [name], she BF 1st child for 2months and plans to BF 2nd child longer.  Feeding Mother's Current Feeding Choice: Breast Milk  LATCH Score Latch: Grasps breast easily, tongue down, lips flanged, rhythmical sucking.  Audible Swallowing: A few with stimulation  Type of Nipple: Everted at rest and after stimulation  Comfort (Breast/Nipple): Soft / non-tender  Hold (Positioning): Assistance needed to correctly position infant at breast and maintain latch.  LATCH Score: 8   Lactation Tools Discussed/Used    Interventions Interventions: Breast feeding basics reviewed;Assisted with latch;Skin to skin;Breast compression;Adjust position;Support pillows;Position options;Education;CDC Guidelines for Breast Pump Cleaning;LC Services brochure;Guidelines for Milk Supply and Pumping Schedule Handout;Pace feeding;DEBP  Discharge Pump:  DEBP;Personal  Consult Status Consult Status: Follow-up Date: 01/19/23 Follow-up type: In-patient    Mackenzie Allen 01/18/2023, 4:16 PM

## 2023-01-18 NOTE — Anesthesia Procedure Notes (Signed)
 Epidural Patient location during procedure: OB Start time: 01/18/2023 9:28 AM End time: 01/18/2023 9:32 AM  Staffing Anesthesiologist: Mallory Manus, MD  Preanesthetic Checklist Completed: patient identified, IV checked, site marked, risks and benefits discussed, surgical consent, monitors and equipment checked, pre-op evaluation and timeout performed  Epidural Patient position: sitting Prep: DuraPrep and site prepped and draped Patient monitoring: continuous pulse ox and blood pressure Approach: midline Location: L4-L5 Injection technique: LOR air  Needle:  Needle type: Tuohy  Needle gauge: 17 G Needle length: 9 cm and 9 Needle insertion depth: 6 cm Catheter type: closed end flexible Catheter size: 19 Gauge Catheter at skin depth: 12 cm Test dose: negative  Assessment Events: blood not aspirated, no cerebrospinal fluid, injection not painful, no injection resistance, no paresthesia and negative IV test

## 2023-01-18 NOTE — Progress Notes (Signed)
 Mackenzie Allen is a 28 y.o. G3P1011 at [redacted]w[redacted]d by LMP admitted for induction of labor due to chronic Hypertension   Subjective: Patient comfortable with epidural . Introductions exchanged. Family and FOB at bedside and supportive.   Objective: BP (!) 164/98   Pulse 68   Temp 98.7 F (37.1 C) (Oral)   Resp 20   Ht 5' 7 (1.702 m)   Wt 76.6 kg   LMP 04/23/2022 (Exact Date)   SpO2 99%   BMI 26.44 kg/m  No intake/output data recorded. No intake/output data recorded.  FHT:  FHR: 135 bpm, variability: moderate,  accelerations:  Present,  decelerations:  Absent UC:   irregular, every 3-5 minutes SVE:   Dilation: 4.0 Effacement (%): 90 Station: -1 Exam by:: CANDIE Cedar CNM   Labs: Lab Results  Component Value Date   WBC 8.2 01/17/2023   HGB 11.0 (L) 01/17/2023   HCT 32.7 (L) 01/17/2023   MCV 89.6 01/17/2023   PLT 211 01/17/2023   CNM to patient bedside to discuss AROM. Reviewed risks and benefits with patient and family. Patient desires to continue with plan for AROM. SVE 4/90-2 and FHT cat I prior to AROM. AROM successful on first attempt with amnihook. Clear fluid noted. FHT remained cat I following AROM.   Assessment / Plan: Induction of labor due to cHTN,  progressing well on pitocin   Labor: Progressing on Pitocin , AROM completed. Continue to titrate pit up as needed.  Preeclampsia:  labs stable Fetal Wellbeing:  Category I Pain Control:  Epidural I/D:   GBS pos < PCN Anticipated MOD:  NSVD  Claris CHRISTELLA Cedar, CNM 01/18/2023, 9:34 AM

## 2023-01-18 NOTE — Anesthesia Preprocedure Evaluation (Signed)
 Anesthesia Evaluation  Patient identified by MRN, date of birth, ID band Patient awake    Reviewed: Allergy & Precautions, H&P , NPO status , Patient's Chart, lab work & pertinent test results, reviewed documented beta blocker date and time   Airway Mallampati: II  TM Distance: >3 FB Neck ROM: full    Dental no notable dental hx. (+) Teeth Intact, Dental Advisory Given   Pulmonary neg pulmonary ROS, former smoker   Pulmonary exam normal breath sounds clear to auscultation       Cardiovascular hypertension, Pt. on medications negative cardio ROS Normal cardiovascular exam Rhythm:regular Rate:Normal     Neuro/Psych negative neurological ROS  negative psych ROS   GI/Hepatic negative GI ROS, Neg liver ROS,,,  Endo/Other  negative endocrine ROS    Renal/GU negative Renal ROS  negative genitourinary   Musculoskeletal   Abdominal   Peds  Hematology negative hematology ROS (+)   Anesthesia Other Findings   Reproductive/Obstetrics (+) Pregnancy                             Anesthesia Physical Anesthesia Plan  ASA: 2  Anesthesia Plan: Epidural   Post-op Pain Management: Minimal or no pain anticipated   Induction: Intravenous  PONV Risk Score and Plan: 2 and Treatment may vary due to age or medical condition  Airway Management Planned: Natural Airway and Simple Face Mask  Additional Equipment: None  Intra-op Plan:   Post-operative Plan:   Informed Consent: I have reviewed the patients History and Physical, chart, labs and discussed the procedure including the risks, benefits and alternatives for the proposed anesthesia with the patient or authorized representative who has indicated his/her understanding and acceptance.     Dental Advisory Given  Plan Discussed with:   Anesthesia Plan Comments: (Labs checked- platelets confirmed with RN in room. Fetal heart tracing, per RN, reported  to be stable enough for sitting procedure. Discussed epidural, and patient consents to the procedure:  included risk of possible headache,backache, failed block, allergic reaction, and nerve injury. This patient was asked if she had any questions or concerns before the procedure started.)       Anesthesia Quick Evaluation

## 2023-01-18 NOTE — Progress Notes (Signed)
 Patient ID: Mackenzie Allen, female   DOB: 20-Mar-1995, 28 y.o.   MRN: [id number]  S/p cyto with Pit started at 0600; feeling ctx stronger now; open to AROM today  BPs150/97, 149/97, 140/85 FHR 120-130s, +accels, no decels Ctx q 2-3 mins with Pit @ 33mu/min Cx deferred (was 3-4/80/vtx -2 @ 0600)  IUP@38 .4wks cHTN IOL process  -Continue to uptitrate Pit to achieve active labor -Plan for AROM today -Continue home Procardia  XL 30mg  -Anticipate vag delivery  [name] CNM 01/18/2023 7:37 AM

## 2023-01-19 ENCOUNTER — Other Ambulatory Visit (HOSPITAL_COMMUNITY): Payer: Self-pay

## 2023-01-19 DIAGNOSIS — D62 Acute posthemorrhagic anemia: Secondary | ICD-10-CM | POA: Diagnosis not present

## 2023-01-19 LAB — COMPREHENSIVE METABOLIC PANEL
ALT: 17 U/L (ref 0–44)
AST: 20 U/L (ref 15–41)
Albumin: 2.2 g/dL — ABNORMAL LOW (ref 3.5–5.0)
Alkaline Phosphatase: 141 U/L — ABNORMAL HIGH (ref 38–126)
Anion gap: 7 (ref 5–15)
BUN: 8 mg/dL (ref 6–20)
CO2: 23 mmol/L (ref 22–32)
Calcium: 8.9 mg/dL (ref 8.9–10.3)
Chloride: 106 mmol/L (ref 98–111)
Creatinine, Ser: 0.7 mg/dL (ref 0.44–1.00)
GFR, Estimated: >60 mL/min (ref >60)
Glucose, Bld: 80 mg/dL (ref 70–99)
Potassium: 4.2 mmol/L (ref 3.5–5.1)
Sodium: 136 mmol/L (ref 135–145)
Total Bilirubin: 0.3 mg/dL (ref 0.0–1.2)
Total Protein: 5.4 g/dL — ABNORMAL LOW (ref 6.5–8.1)

## 2023-01-19 LAB — CBC
HCT: 28.2 % — ABNORMAL LOW (ref 36.0–46.0)
Hemoglobin: 9.4 g/dL — ABNORMAL LOW (ref 12.0–15.0)
MCH: 30.2 pg (ref 26.0–34.0)
MCHC: 33.3 g/dL (ref 30.0–36.0)
MCV: 90.7 fL (ref 80.0–100.0)
Platelets: 177 10*3/uL (ref 150–400)
RBC: 3.11 MIL/uL — ABNORMAL LOW (ref 3.87–5.11)
RDW: 14.2 % (ref 11.5–15.5)
WBC: 7.8 10*3/uL (ref 4.0–10.5)
nRBC: 0 % (ref 0.0–0.2)

## 2023-01-19 MED ORDER — FERROUS SULFATE 325 (65 FE) MG PO TABS
325.0000 mg | ORAL_TABLET | ORAL | 0 refills | Status: AC
  Filled 2023-01-19: qty 30, 60d supply, fill #0

## 2023-01-19 MED ORDER — IBUPROFEN 600 MG PO TABS
600.0000 mg | ORAL_TABLET | Freq: Four times a day (QID) | ORAL | 1 refills | Status: AC
  Filled 2023-01-19: qty 30, 8d supply, fill #0

## 2023-01-19 MED ORDER — FUROSEMIDE 20 MG PO TABS
20.0000 mg | ORAL_TABLET | Freq: Every day | ORAL | 1 refills | Status: AC
  Filled 2023-01-19: qty 5, 5d supply, fill #0

## 2023-01-19 MED ORDER — NIFEDIPINE ER 30 MG PO TB24
30.0000 mg | ORAL_TABLET | Freq: Every day | ORAL | 2 refills | Status: AC
  Filled 2023-01-19: qty 30, 30d supply, fill #0

## 2023-01-19 MED ORDER — FUROSEMIDE 20 MG PO TABS
20.0000 mg | ORAL_TABLET | Freq: Every day | ORAL | Status: DC
  Administered 2023-01-19: 20 mg via ORAL
  Filled 2023-01-19: qty 1

## 2023-01-19 MED ORDER — ACETAMINOPHEN 500 MG PO TABS: 1000.0000 mg | ORAL_TABLET | Freq: Four times a day (QID) | ORAL | Status: AC | PRN

## 2023-01-19 NOTE — Progress Notes (Signed)
 [name] was referred for history of anxiety.  * Referral screened out by Clinical Social Worker because none of the following criteria appear to apply:  ~ History of anxiety during this pregnancy, or of post-partum depression following prior delivery.  ~ Diagnosis of anxiety within last 3 years  OR  * [name]'s symptoms currently being treated with medication and/or therapy.  Per OB notes, [name] is currently participating in therapy with Andrea Figueora.   Please contact the Clinical Social Worker if needs arise, by [facility] request, or if [name] scores greater than 9/yes to question 10 on Edinburgh Postpartum Depression Screen.  Mackenzie Allen, [place] Clinical Social Worker [phone]

## 2023-01-19 NOTE — Lactation Note (Signed)
 This note was copied from a baby's chart. Lactation Consultation Note  Patient Name: Mackenzie Allen Date: 01/19/2023 Age:27 hours Reason for consult: Follow-up assessment;Early term 37-38.6wks;Infant weight loss (4 % weight loss) Per mom breast feeding going well and baby is latching on both breast.  LC updated the doc flow sheets per mom.  LC reviewed BF D/C teaching and the [facility] resources.  LC answered moms BF questions and provided a hand pump with  Instructions.   Maternal Data Has patient been taught Hand Expression?: Yes  Feeding Mother's Current Feeding Choice: Breast Milk  LATCH Score - 8-9     Lactation Tools Discussed/Used Tools: Pump;Flanges Flange Size: 21;24;27 Breast pump type: Manual Pump Education: Milk Storage;Setup, frequency, and cleaning  Interventions Interventions: Breast feeding basics reviewed;Hand pump;Education;CDC Guidelines for Breast Pump Cleaning  Discharge Discharge Education: Engorgement and breast care;Warning signs for feeding baby Pump: Personal;DEBP;Manual  Consult Status Consult Status: Complete Date: 01/19/23    Rollene Jenkins Fiedler 01/19/2023, 4:39 PM

## 2023-01-20 ENCOUNTER — Encounter: Payer: Self-pay | Admitting: Family Medicine

## 2023-01-22 ENCOUNTER — Encounter: Payer: Medicaid Other | Admitting: Advanced Practice Midwife

## 2023-01-26 ENCOUNTER — Ambulatory Visit: Payer: Medicaid Other

## 2023-01-29 ENCOUNTER — Encounter: Payer: Medicaid Other | Admitting: Obstetrics and Gynecology

## 2023-01-30 ENCOUNTER — Telehealth (HOSPITAL_COMMUNITY): Payer: Self-pay | Admitting: *Deleted

## 2023-01-30 NOTE — Telephone Encounter (Signed)
 Attempted hospital discharge follow-up call. Left message for patient to return RN call with any questions or concerns. Deforest Hoyles, RN, 01/30/23, [phone]

## 2023-02-21 ENCOUNTER — Ambulatory Visit: Payer: Medicaid Other | Admitting: Obstetrics and Gynecology

## 2023-05-10 IMAGING — US US OB < 14 WEEKS - US OB TV
1 series · 15 of 28 positions shown · non-contrast
Comparison: None for this pregnancy.

CLINICAL DATA: Bleeding for 1 week.  First-trimester pregnancy.

EXAM:
OBSTETRIC <14 WK US AND TRANSVAGINAL OB US
TECHNIQUE: Both transabdominal and transvaginal ultrasound examinations were
performed for complete evaluation of the gestation as well as the
maternal uterus, adnexal regions, and pelvic cul-de-sac.
Transvaginal technique was performed to assess early pregnancy.

[Series 1: us ob < 14 weeks - us ob tv · 128 acquisitions, 15 frames shown]
[im 1/128]
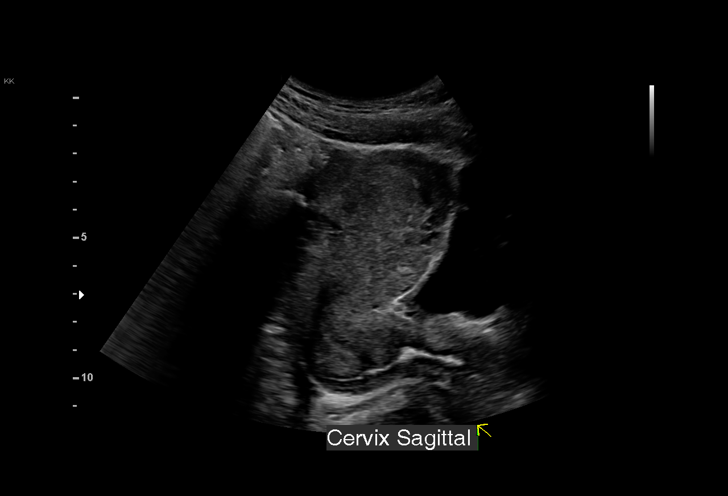
[im 10/128]
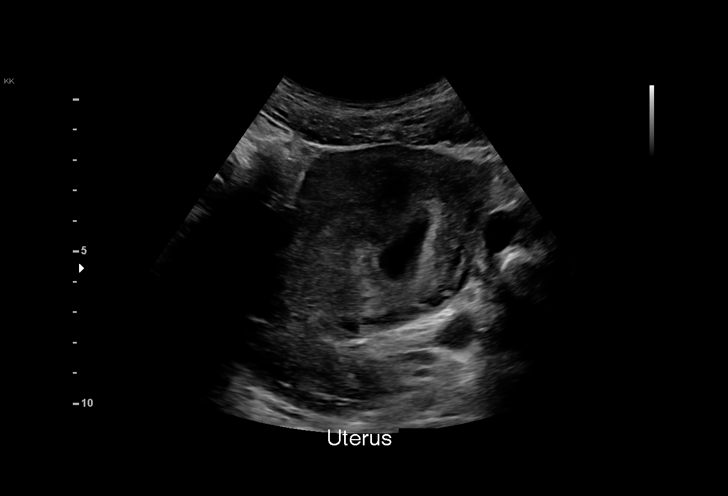
[im 19/128]
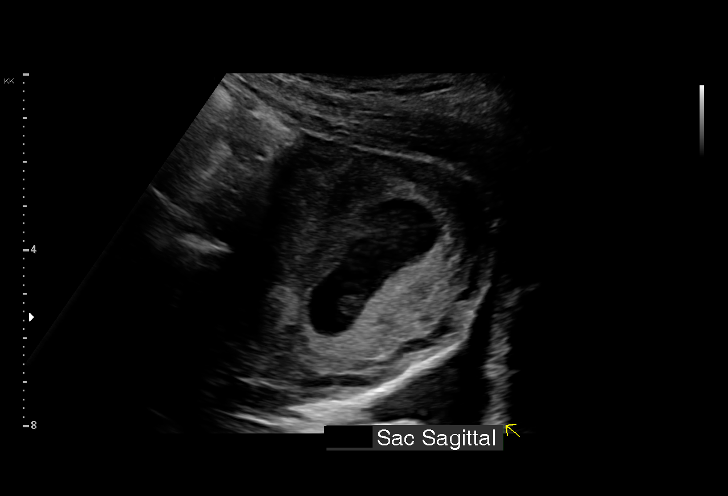
[im 29/128]
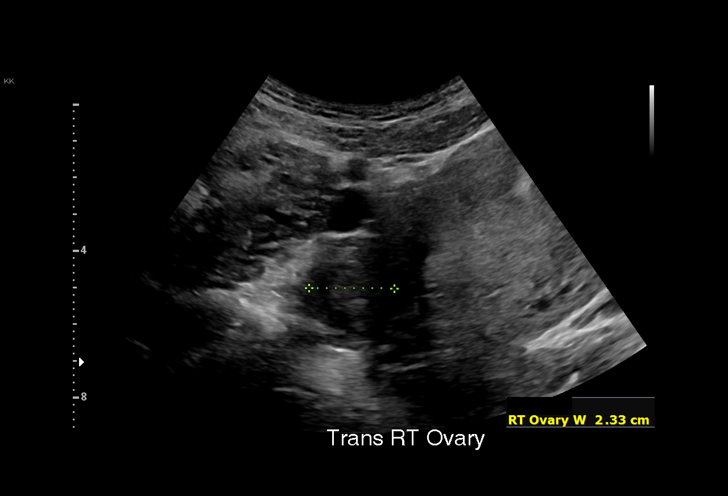
[im 38/128]
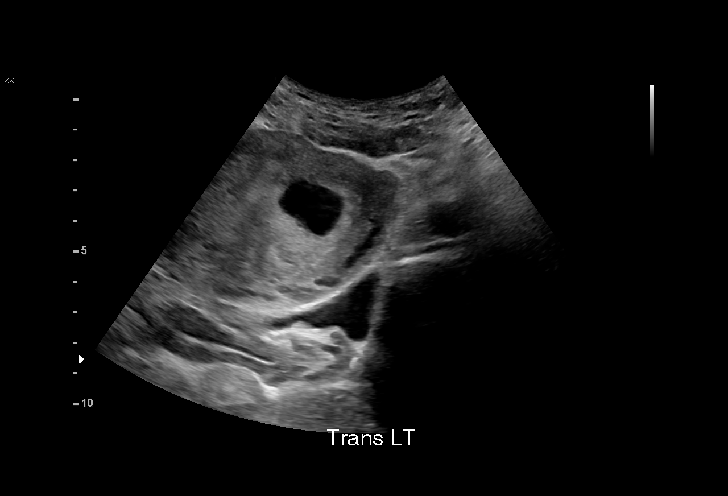
[im 48/128]
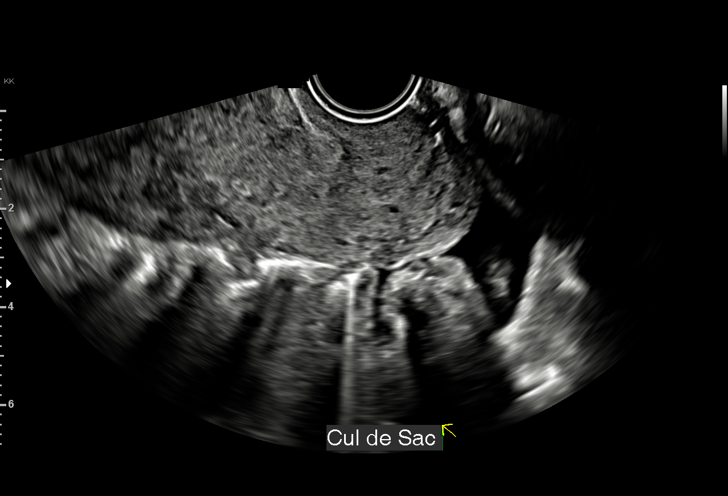
[im 57/128]
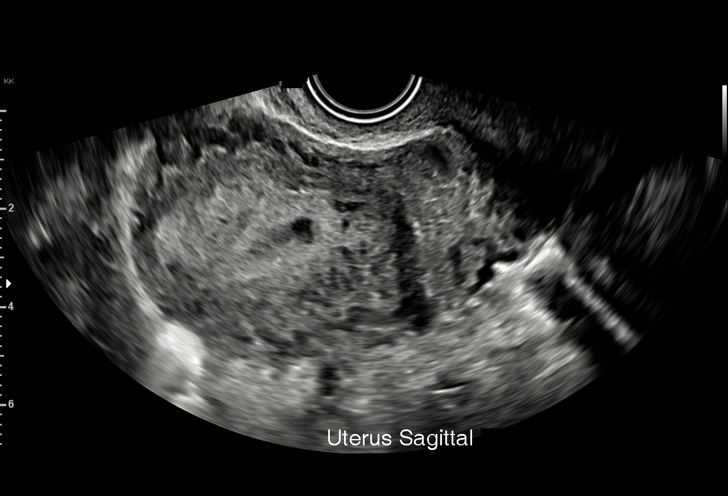
[im 66/128]
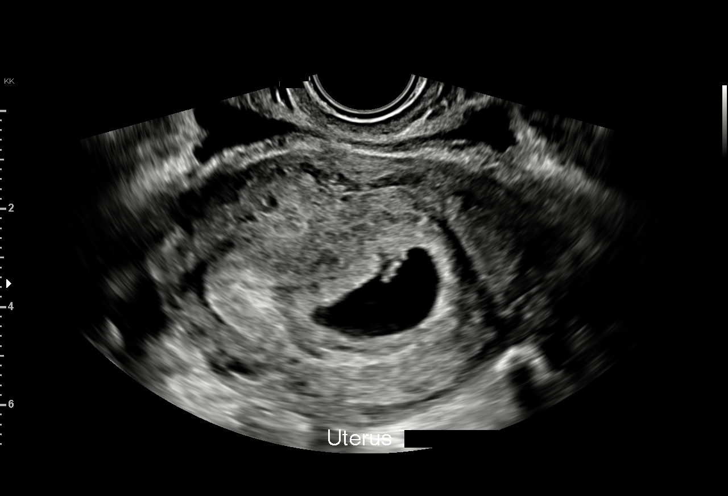
[im 71/128]
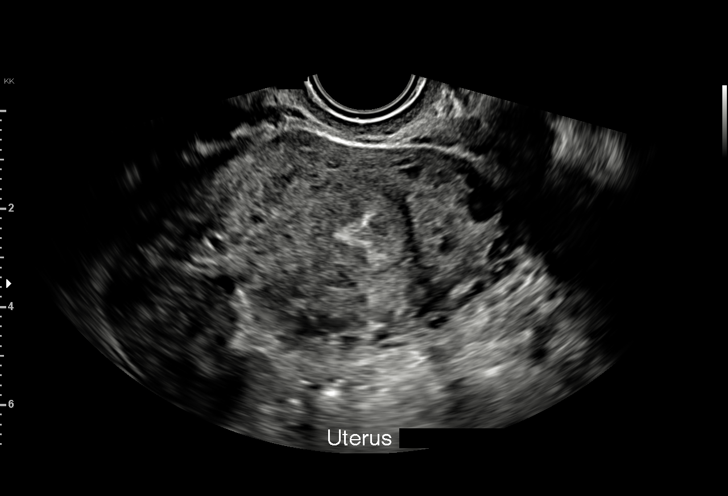
[im 80/128]
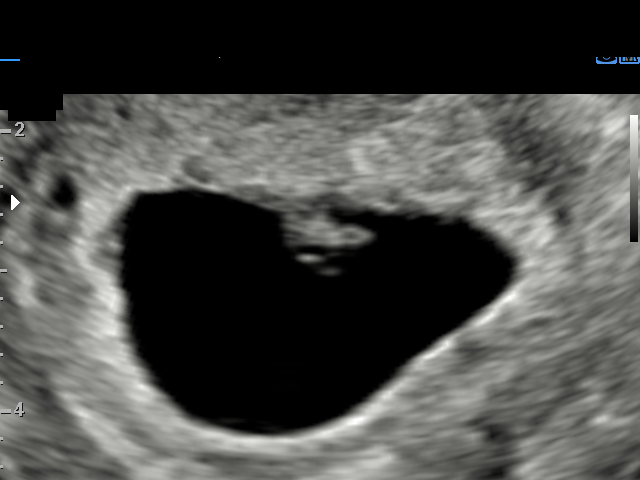
[im 90/128]
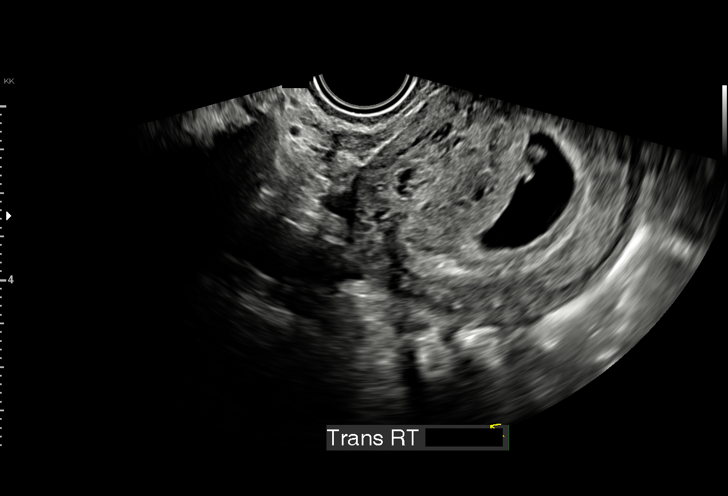
[im 99/128]
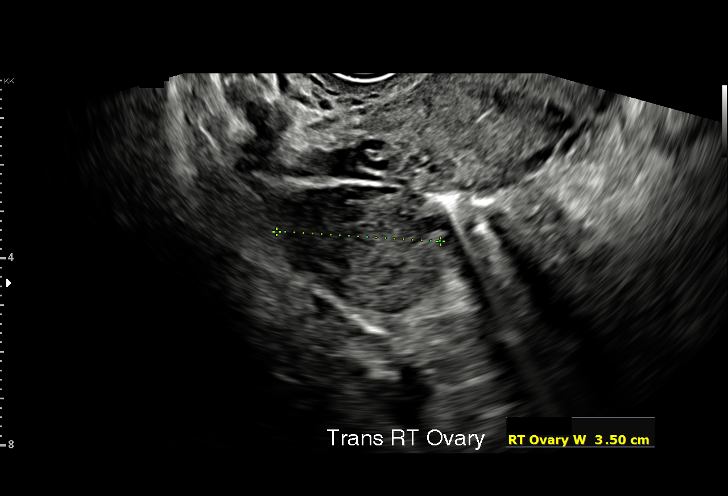
[im 109/128]
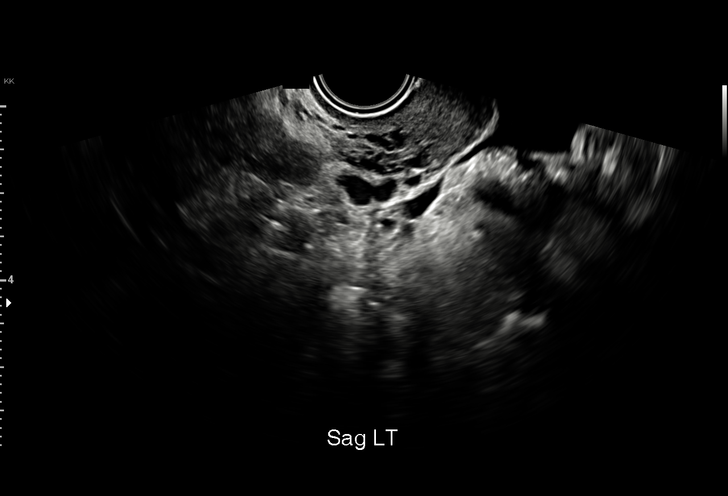
[im 118/128]
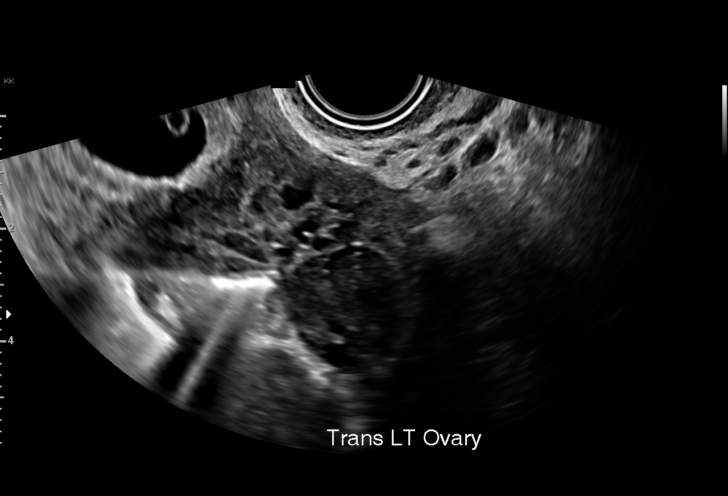
[im 128/128]
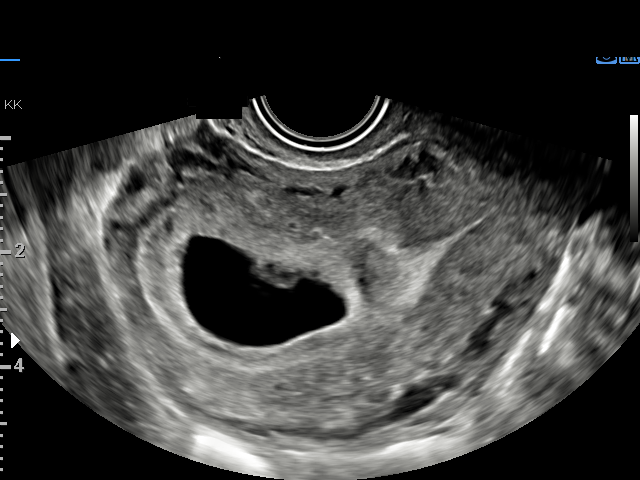

[15 of 28 positions shown; findings below may reference images not displayed]

FINDINGS: Intrauterine gestational sac: Single

Yolk sac:  Single

Embryo:  Single

Cardiac Activity: Present

Heart Rate: 126 bpm

CRL:  6.5 mm   6 w   3 d                  US EDC: 01/14/2022

Subchorionic hemorrhage: A small subchorionic hemorrhage encompasses
less than 25% of the gestational sac.

Maternal uterus/adnexae: Corpus luteal cyst noted on the right.
Adnexa otherwise unremarkable. A small amount of free fluid is
present.
IMPRESSION: 1. Single intrauterine pregnancy with estimated gestational age of 6
weeks and 3 days.
2. Small subchorionic hemorrhage.

## 2023-05-11 IMAGING — US US OB TRANSVAGINAL
1 series · 15 of 28 positions shown · non-contrast
Comparison: Obstetric ultrasound yesterday.

CLINICAL DATA: Pregnant patient with vaginal bleeding and pain
since yesterday.

EXAM:
TRANSVAGINAL OB ULTRASOUND
TECHNIQUE: Transvaginal ultrasound was performed for complete evaluation of the
gestation as well as the maternal uterus, adnexal regions, and
pelvic cul-de-sac.

[Series 1: us ob transvaginal · 47 acquisitions, 15 frames shown]
[im 1/47]
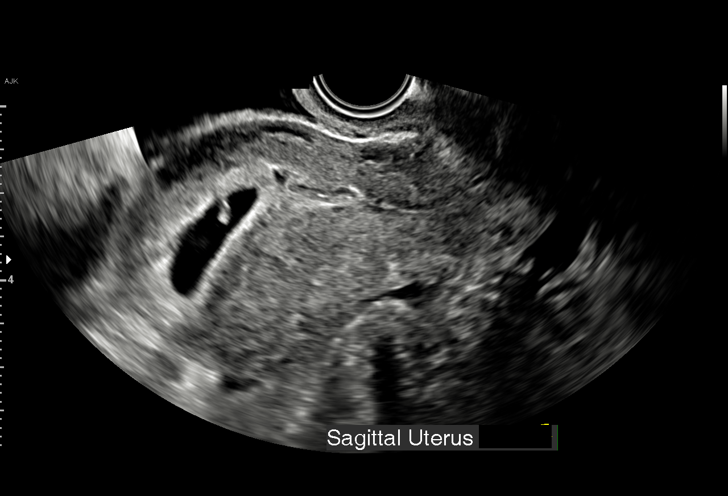
[im 4/47]
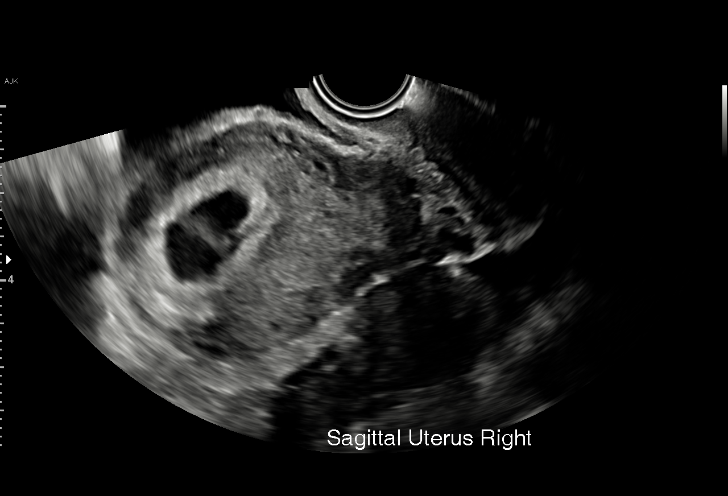
[im 7/47]
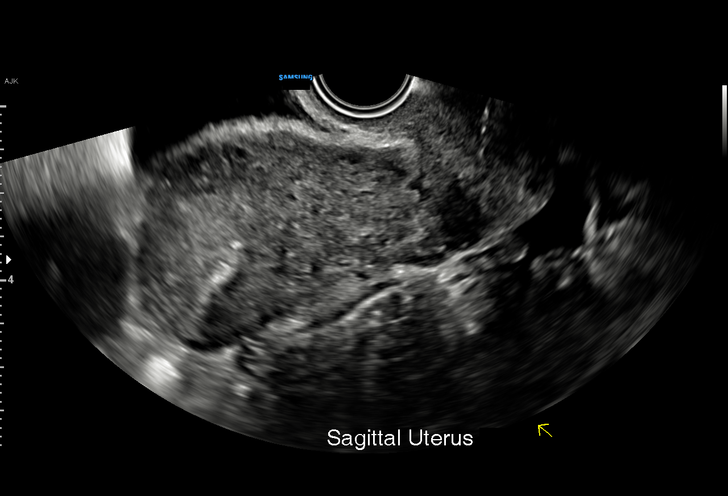
[im 11/47]
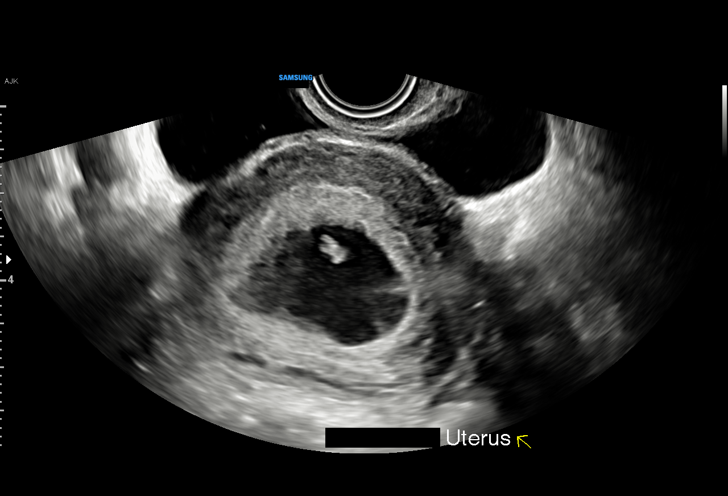
[im 14/47]
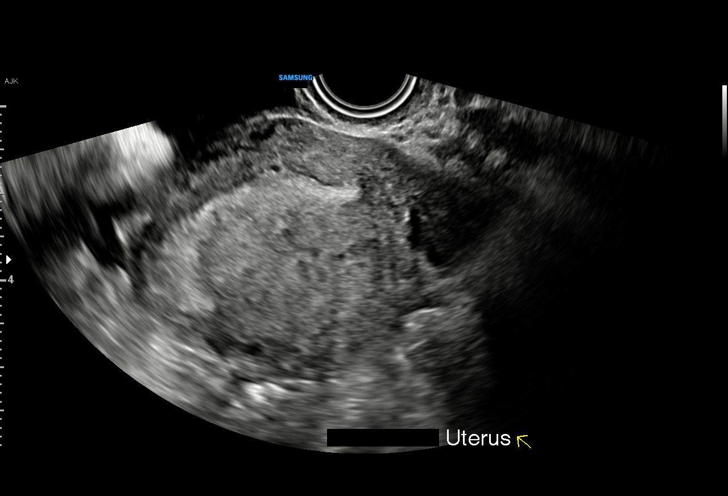
[im 18/47]
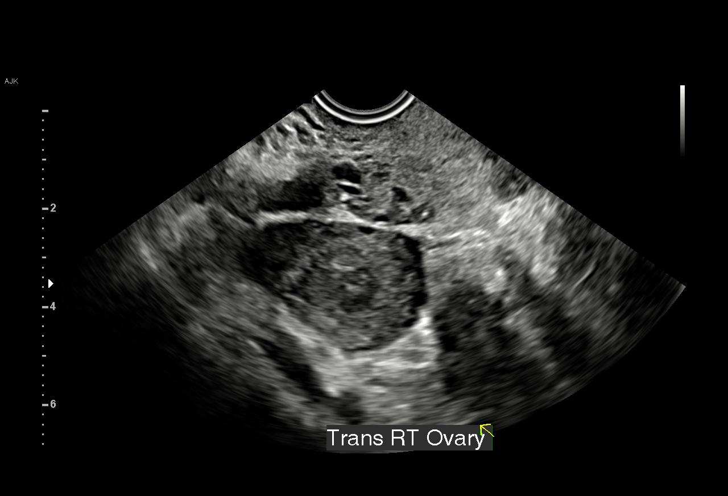
[im 21/47]
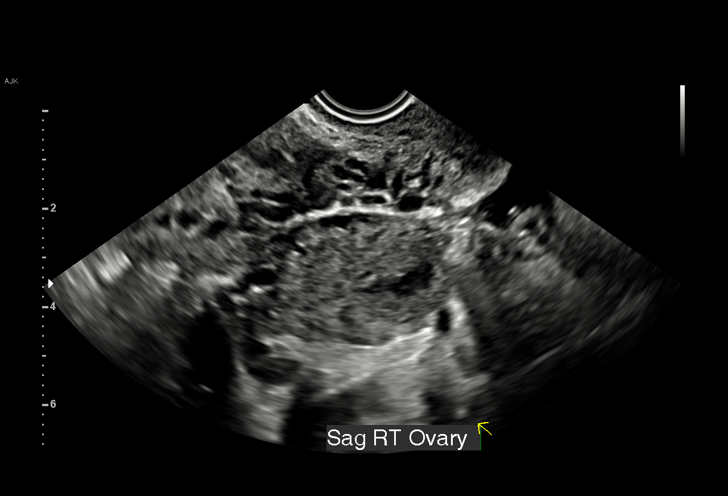
[im 24/47]
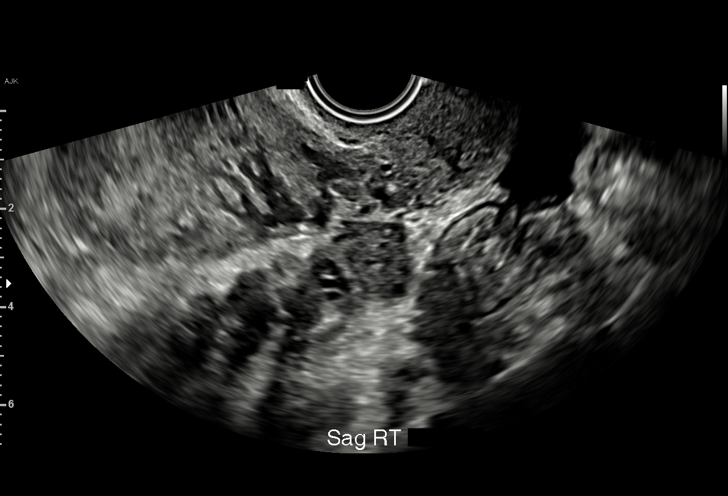
[im 26/47]
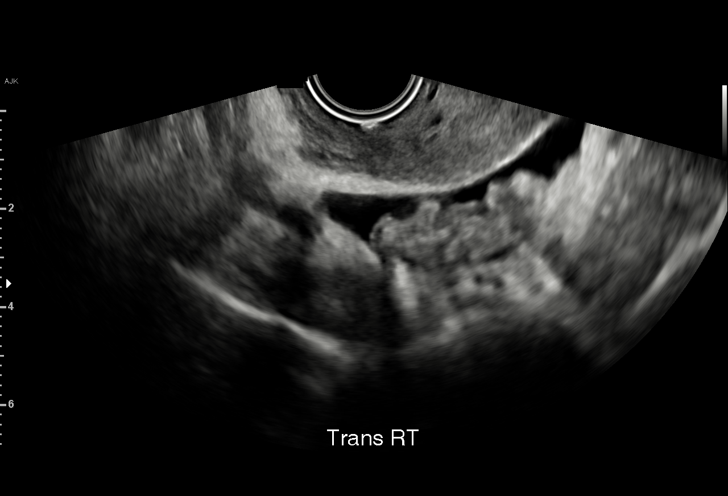
[im 29/47]
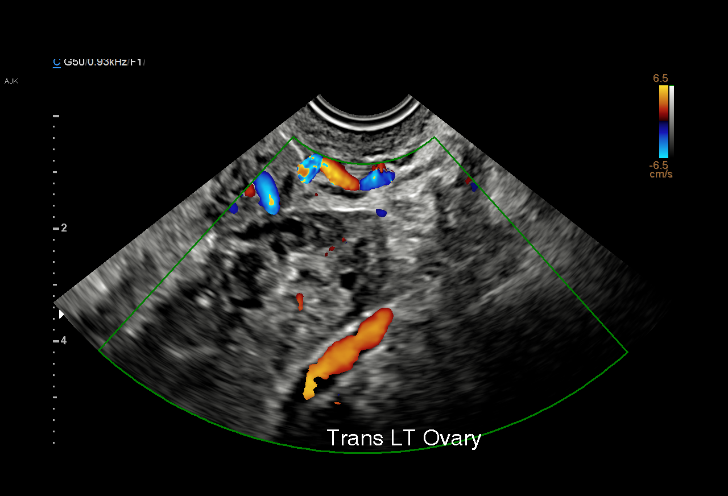
[im 33/47]
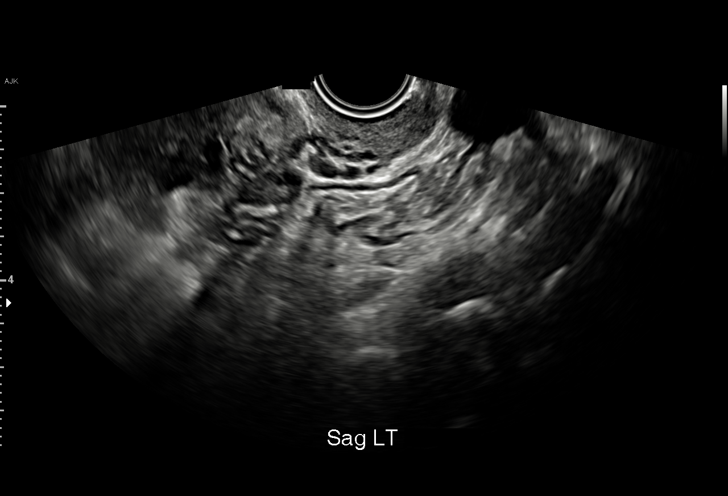
[im 36/47]
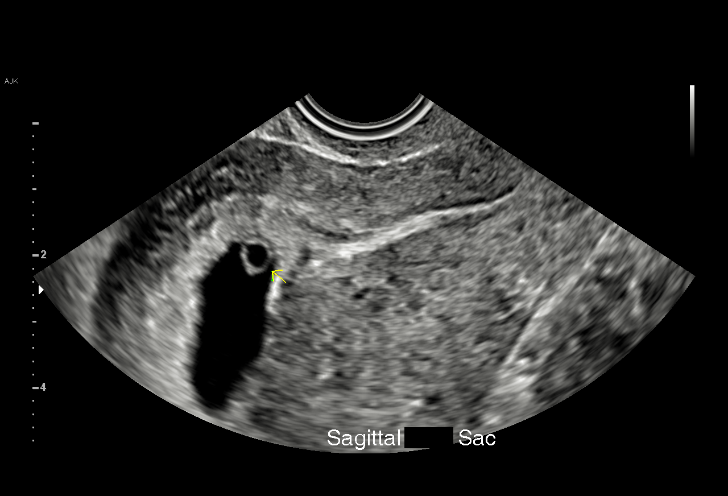
[im 40/47]
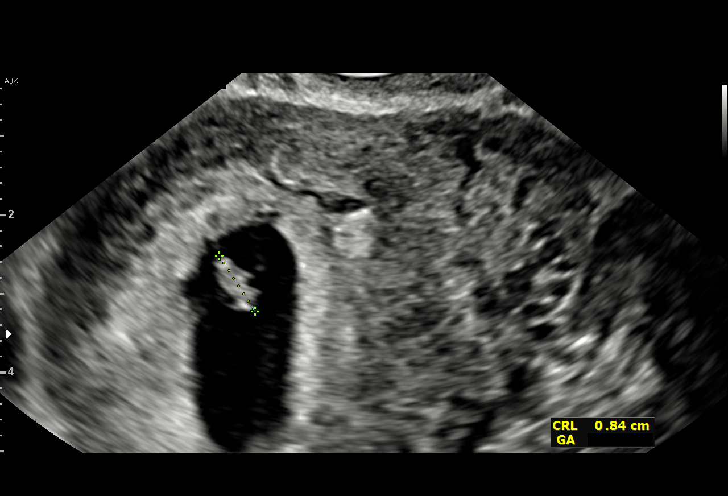
[im 43/47]
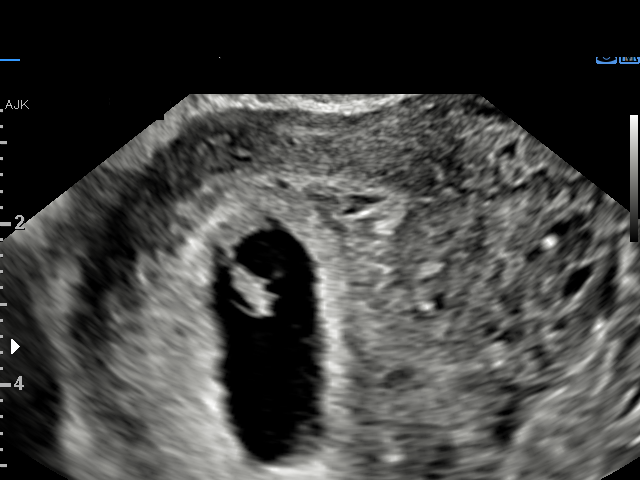
[im 47/47]
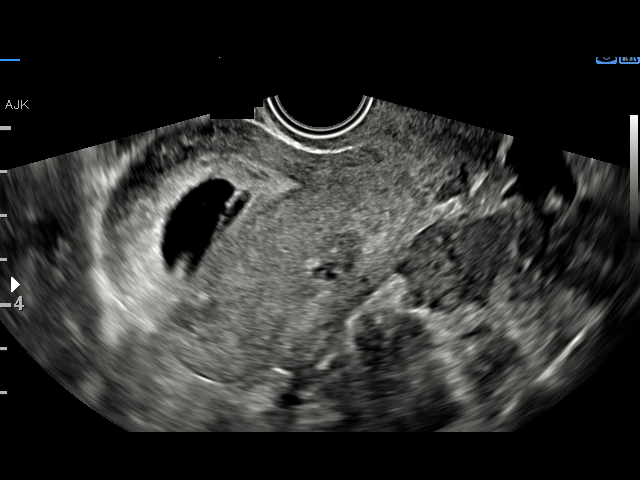

[15 of 28 positions shown; findings below may reference images not displayed]

FINDINGS: Intrauterine gestational sac: Single

Yolk sac:  Visualized.

Embryo:  Visualized.

Cardiac Activity: Visualized.

Heart Rate: 117 bpm

CRL:   8.5 mm   6 w 5 d                  US EDC: 01/13/2022

Subchorionic hemorrhage: Small measuring approximately 1.3 x 0.3 x
1.5 cm, minimally increased.

Maternal uterus/adnexae: Both ovaries are visualized with a corpus
luteal cyst on the right. Ovarian blood flow is seen. No adnexal
mass small amount of free fluid in the pelvis appears simple.
IMPRESSION: 1. Single live intrauterine gestation estimated gestational age 6
weeks 5 days based on crown-rump length for ultrasound EDC
01/13/2022.
2. Small subchorionic hemorrhage, minimally increased from
yesterday.

## 2023-05-31 IMAGING — US US OB TRANSVAGINAL
1 series · 15 of 28 positions shown · non-contrast
Comparison: Obstetrical ultrasound 05/25/2021

CLINICAL DATA: Vaginal bleeding following abortion pill.

EXAM:
TRANSVAGINAL OB ULTRASOUND
TECHNIQUE: Transvaginal ultrasound was performed for complete evaluation of the
gestation as well as the maternal uterus, adnexal regions, and
pelvic cul-de-sac.

[Series 1: us ob transvaginal · 15 of 36 slices shown]
[im 1/36]
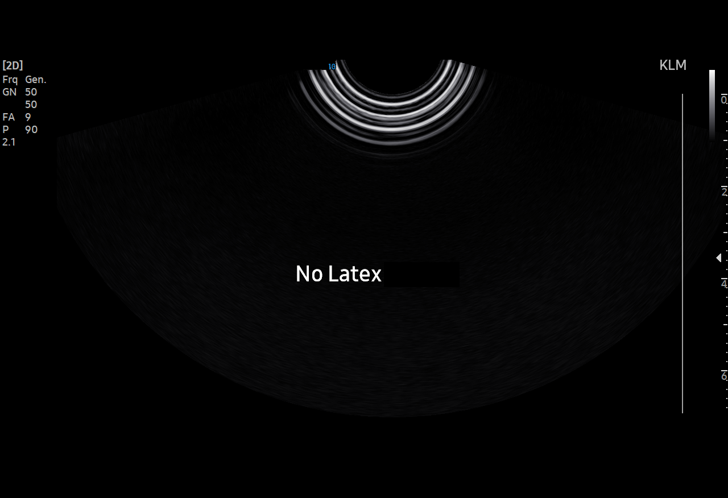
[im 3/36]
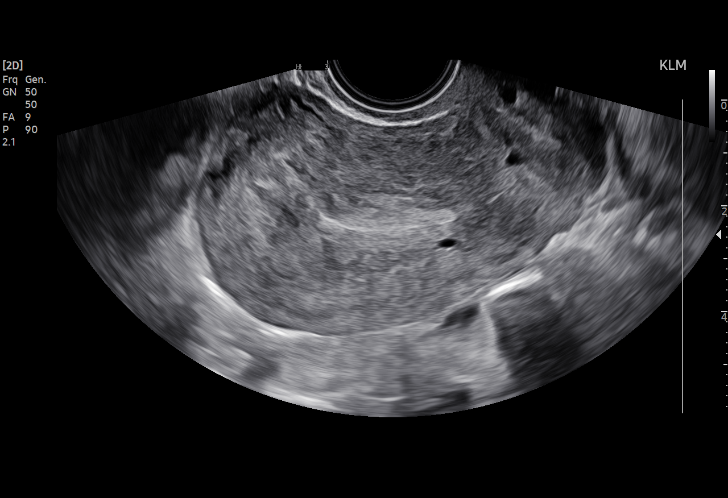
[im 6/36]
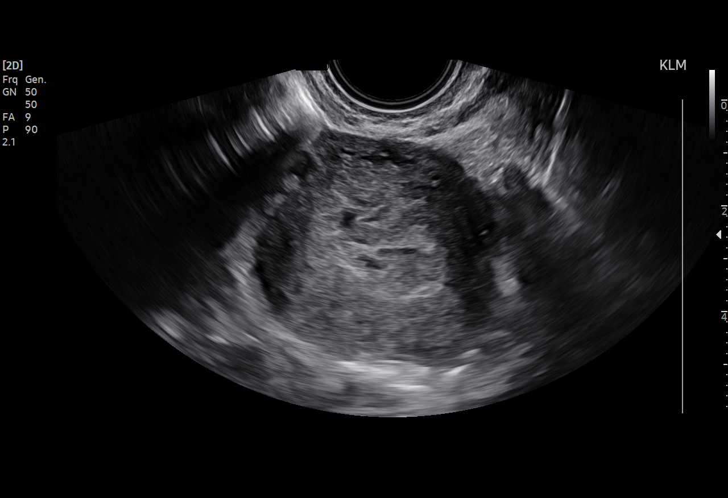
[im 8/36]
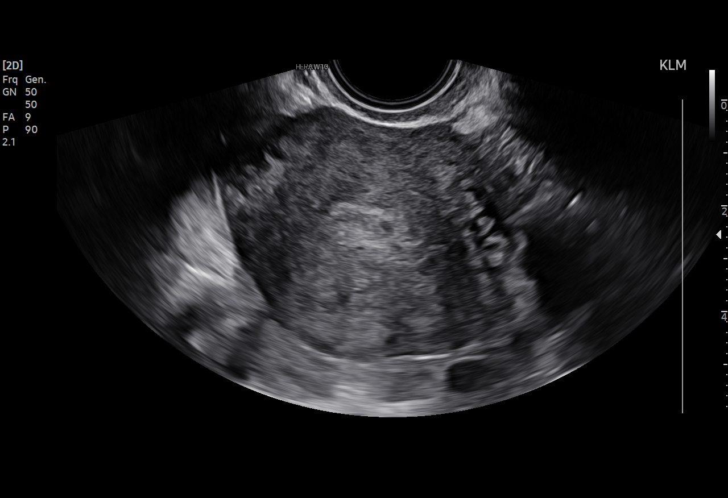
[im 11/36]
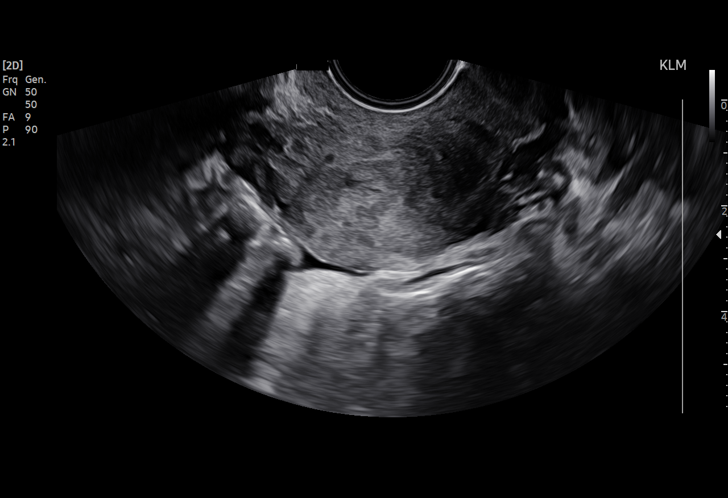
[im 13/36]
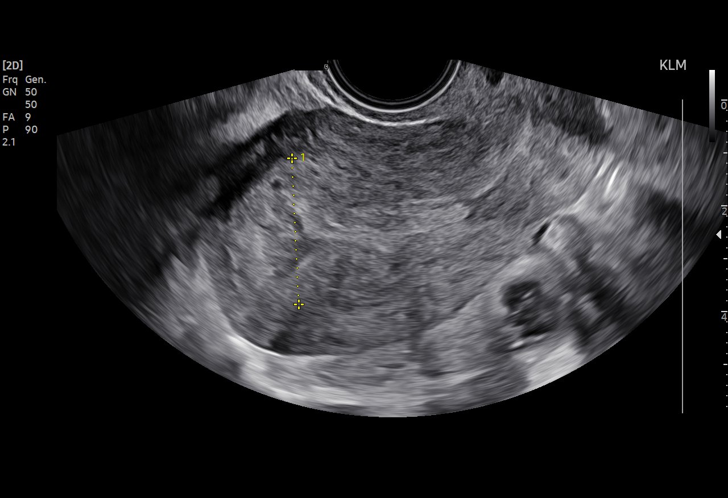
[im 16/36]
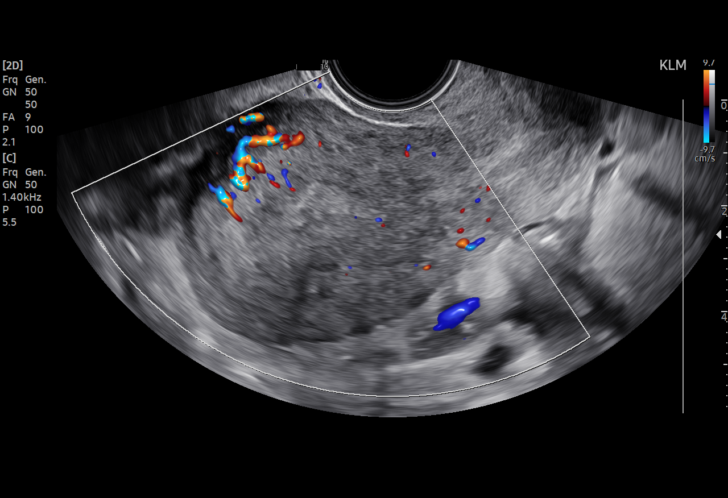
[im 19/36]
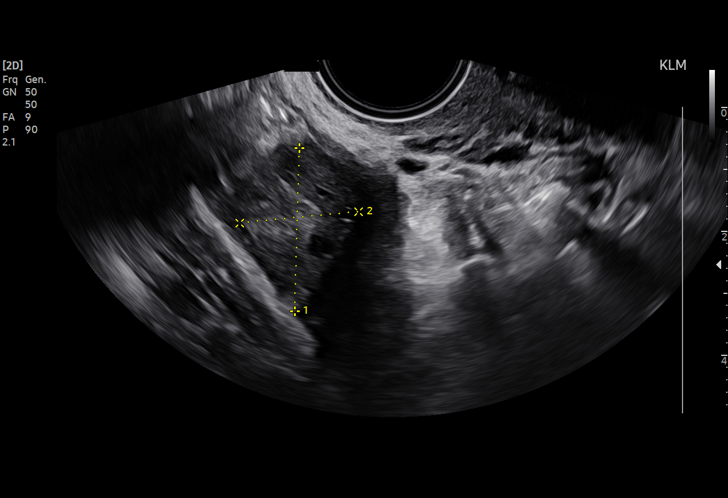
[im 20/36]
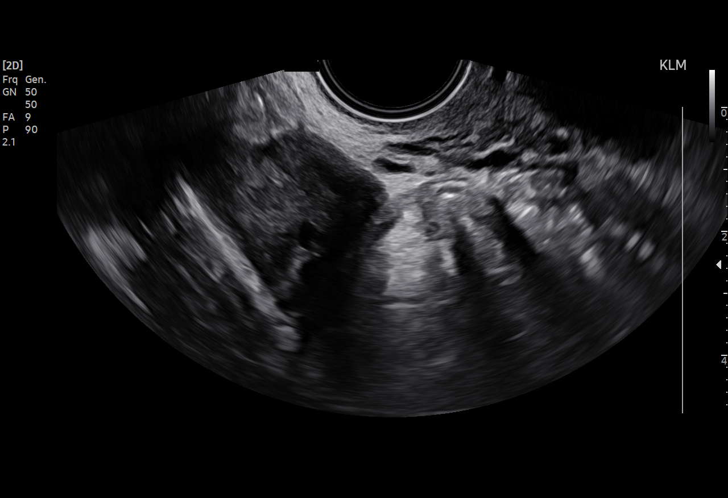
[im 23/36]
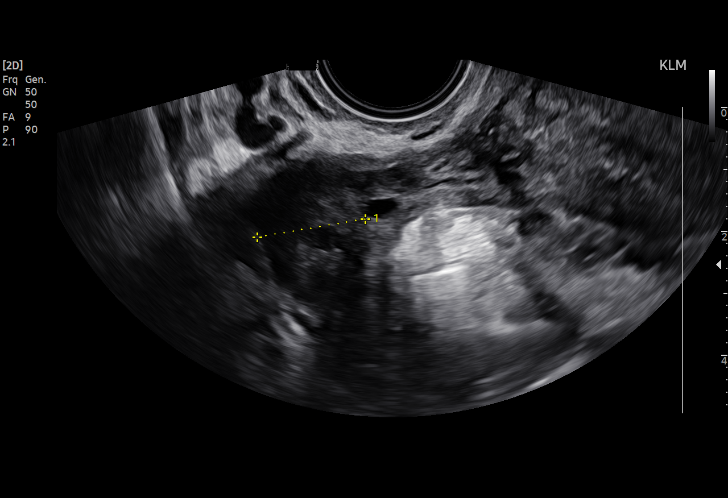
[im 25/36]
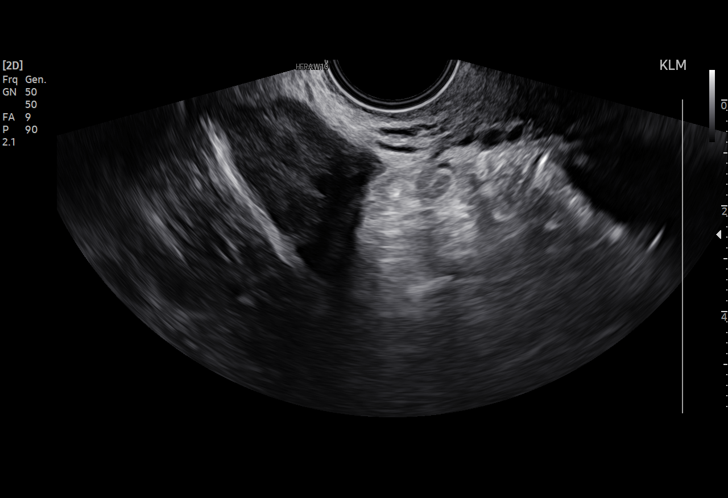
[im 28/36]
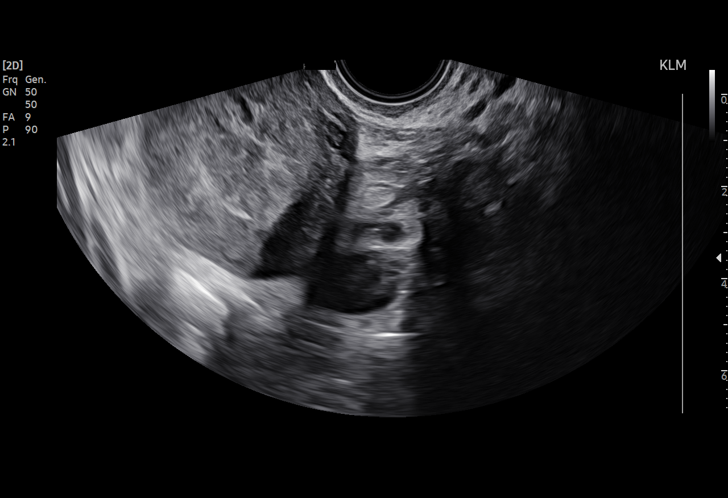
[im 30/36]
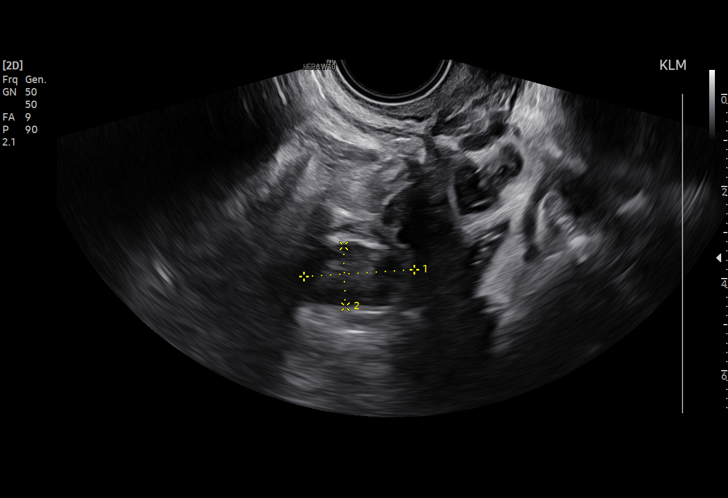
[im 33/36]
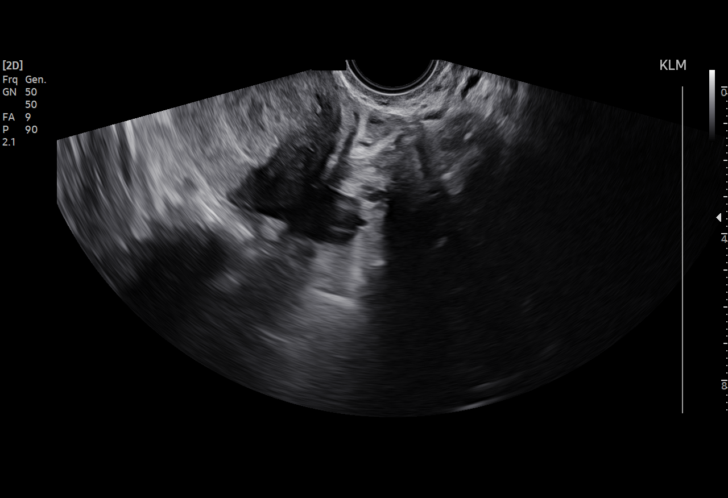
[im 36/36]
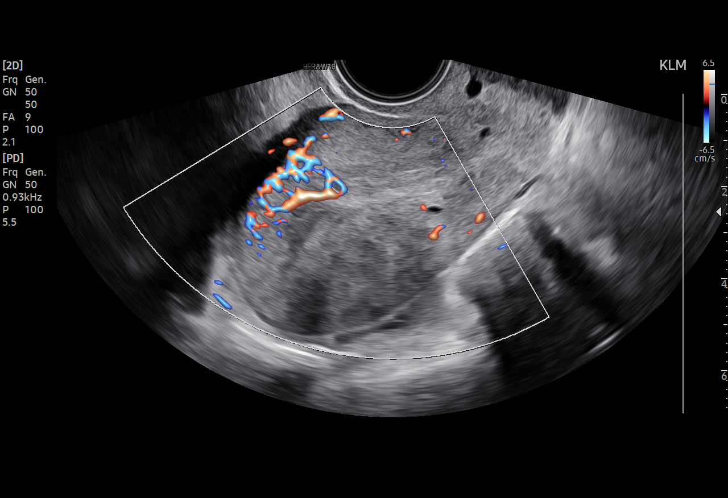

[15 of 28 positions shown; findings below may reference images not displayed]

FINDINGS: Intrauterine gestational sac: None

Endometrium: Heterogeneous and thickened measuring up to 2.7 cm.
There is some increased vascular flow seen within the endometrium.

Maternal uterus/adnexae: The bilateral ovaries appear within normal
limits. No adnexal mass. No free fluid.
IMPRESSION: 1. No intrauterine gestational sac identified. Thickened
heterogeneous endometrium containing vascularity worrisome for
retained products of conception.

## 2024-01-28 ENCOUNTER — Other Ambulatory Visit: Payer: Self-pay | Admitting: Obstetrics & Gynecology

## 2024-01-28 DIAGNOSIS — Z8619 Personal history of other infectious and parasitic diseases: Secondary | ICD-10-CM

## 2024-01-31 ENCOUNTER — Other Ambulatory Visit: Payer: Self-pay
